# Patient Record
Sex: Male | Born: 1985 | Race: Black or African American | Hispanic: No | Marital: Single | Smoking: Current every day smoker
Health system: Southern US, Community
[De-identification: ages and names within clinical notes are randomized; demographics above are authoritative.]

## PROBLEM LIST (undated history)

## (undated) DIAGNOSIS — R569 Unspecified convulsions: Secondary | ICD-10-CM

---
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
## 2004-04-13 ENCOUNTER — Emergency Department (HOSPITAL_COMMUNITY): Admission: EM | Admit: 2004-04-13 | Discharge: 2004-04-13 | Payer: Self-pay | Admitting: Emergency Medicine

## 2004-06-05 ENCOUNTER — Emergency Department (HOSPITAL_COMMUNITY): Admission: EM | Admit: 2004-06-05 | Discharge: 2004-06-05 | Payer: Self-pay | Admitting: Emergency Medicine

## 2004-06-13 ENCOUNTER — Emergency Department (HOSPITAL_COMMUNITY): Admission: EM | Admit: 2004-06-13 | Discharge: 2004-06-13 | Payer: Self-pay | Admitting: Emergency Medicine

## 2006-02-09 ENCOUNTER — Emergency Department (HOSPITAL_COMMUNITY): Admission: EM | Admit: 2006-02-09 | Discharge: 2006-02-10 | Payer: Self-pay | Admitting: Emergency Medicine

## 2006-07-11 ENCOUNTER — Emergency Department (HOSPITAL_COMMUNITY): Admission: EM | Admit: 2006-07-11 | Discharge: 2006-07-11 | Payer: Self-pay | Admitting: Emergency Medicine

## 2006-09-06 ENCOUNTER — Emergency Department (HOSPITAL_COMMUNITY): Admission: EM | Admit: 2006-09-06 | Discharge: 2006-09-06 | Payer: Self-pay | Admitting: Emergency Medicine

## 2006-11-10 ENCOUNTER — Emergency Department (HOSPITAL_COMMUNITY): Admission: EM | Admit: 2006-11-10 | Discharge: 2006-11-10 | Payer: Self-pay | Admitting: Emergency Medicine

## 2006-11-11 ENCOUNTER — Emergency Department (HOSPITAL_COMMUNITY): Admission: EM | Admit: 2006-11-11 | Discharge: 2006-11-11 | Payer: Self-pay | Admitting: Emergency Medicine

## 2007-02-07 ENCOUNTER — Emergency Department (HOSPITAL_COMMUNITY): Admission: EM | Admit: 2007-02-07 | Discharge: 2007-02-07 | Payer: Self-pay | Admitting: Emergency Medicine

## 2007-05-07 ENCOUNTER — Emergency Department (HOSPITAL_COMMUNITY): Admission: EM | Admit: 2007-05-07 | Discharge: 2007-05-07 | Payer: Self-pay | Admitting: Emergency Medicine

## 2007-05-14 ENCOUNTER — Emergency Department (HOSPITAL_COMMUNITY): Admission: EM | Admit: 2007-05-14 | Discharge: 2007-05-14 | Payer: Self-pay | Admitting: Emergency Medicine

## 2008-01-28 ENCOUNTER — Emergency Department (HOSPITAL_COMMUNITY): Admission: EM | Admit: 2008-01-28 | Discharge: 2008-01-29 | Payer: Self-pay | Admitting: Emergency Medicine

## 2008-04-26 ENCOUNTER — Emergency Department (HOSPITAL_COMMUNITY): Admission: EM | Admit: 2008-04-26 | Discharge: 2008-04-26 | Payer: Self-pay | Admitting: Emergency Medicine

## 2009-01-23 ENCOUNTER — Emergency Department (HOSPITAL_COMMUNITY): Admission: EM | Admit: 2009-01-23 | Discharge: 2009-01-23 | Payer: Self-pay | Admitting: Emergency Medicine

## 2009-03-07 ENCOUNTER — Emergency Department (HOSPITAL_COMMUNITY): Admission: EM | Admit: 2009-03-07 | Discharge: 2009-03-07 | Payer: Self-pay | Admitting: Emergency Medicine

## 2010-03-13 ENCOUNTER — Emergency Department (HOSPITAL_COMMUNITY): Admission: EM | Admit: 2010-03-13 | Discharge: 2010-03-13 | Payer: Self-pay | Admitting: Emergency Medicine

## 2010-05-16 ENCOUNTER — Emergency Department (HOSPITAL_COMMUNITY)
Admission: EM | Admit: 2010-05-16 | Discharge: 2010-05-16 | Payer: Self-pay | Source: Home / Self Care | Admitting: Emergency Medicine

## 2010-10-04 ENCOUNTER — Emergency Department (HOSPITAL_COMMUNITY)
Admission: EM | Admit: 2010-10-04 | Discharge: 2010-10-04 | Discharge status: Against Medical Advice | Payer: Self-pay | Attending: Emergency Medicine | Admitting: Emergency Medicine

## 2010-10-04 DIAGNOSIS — R404 Transient alteration of awareness: Secondary | ICD-10-CM | POA: Insufficient documentation

## 2010-10-04 DIAGNOSIS — J45909 Unspecified asthma, uncomplicated: Secondary | ICD-10-CM | POA: Insufficient documentation

## 2010-10-04 DIAGNOSIS — R569 Unspecified convulsions: Secondary | ICD-10-CM | POA: Insufficient documentation

## 2010-10-04 DIAGNOSIS — I498 Other specified cardiac arrhythmias: Secondary | ICD-10-CM | POA: Insufficient documentation

## 2011-03-10 LAB — I-STAT 8, (EC8 V) (CONVERTED LAB)
Acid-Base Excess: 2
BUN: 10
Bicarbonate: 28.6 — ABNORMAL HIGH
Chloride: 106
Glucose, Bld: 71
HCT: 52
Hemoglobin: 17.7 — ABNORMAL HIGH
Operator id: 284141
Potassium: 4.3
Sodium: 139
TCO2: 30
pCO2, Ven: 50.2 — ABNORMAL HIGH
pH, Ven: 7.363 — ABNORMAL HIGH

## 2011-03-10 LAB — POCT I-STAT CREATININE
Creatinine, Ser: 1
Operator id: 284141

## 2011-04-10 ENCOUNTER — Emergency Department (HOSPITAL_COMMUNITY)
Admission: EM | Admit: 2011-04-10 | Discharge: 2011-04-10 | Disposition: A | Discharge status: Home / Self Care | Payer: Self-pay | Attending: Emergency Medicine | Admitting: Emergency Medicine

## 2011-04-10 ENCOUNTER — Encounter: Payer: Self-pay | Admitting: Emergency Medicine

## 2011-04-10 DIAGNOSIS — Z79899 Other long term (current) drug therapy: Secondary | ICD-10-CM | POA: Insufficient documentation

## 2011-04-10 DIAGNOSIS — G40909 Epilepsy, unspecified, not intractable, without status epilepticus: Secondary | ICD-10-CM | POA: Insufficient documentation

## 2011-04-10 DIAGNOSIS — F17200 Nicotine dependence, unspecified, uncomplicated: Secondary | ICD-10-CM | POA: Insufficient documentation

## 2011-04-10 DIAGNOSIS — R569 Unspecified convulsions: Secondary | ICD-10-CM

## 2011-04-10 HISTORY — DX: Unspecified convulsions: R56.9

## 2011-04-10 MED ORDER — PHENYTOIN SODIUM EXTENDED 100 MG PO CAPS: 300.0000 mg | ORAL_CAPSULE | Freq: Once | ORAL | Status: DC

## 2011-04-10 MED ORDER — PHENYTOIN SODIUM EXTENDED 100 MG PO CAPS: 100.0000 mg | ORAL_CAPSULE | Freq: Three times a day (TID) | ORAL | Status: DC

## 2011-04-10 MED ORDER — PHENYTOIN SODIUM EXTENDED 100 MG PO CAPS
900.0000 mg | ORAL_CAPSULE | Freq: Once | ORAL | Status: DC
  Filled 2011-04-10: qty 9

## 2011-04-10 MED ORDER — PHENYTOIN SODIUM EXTENDED 100 MG PO CAPS
300.0000 mg | ORAL_CAPSULE | Freq: Once | ORAL | Status: AC
  Administered 2011-04-10: 300 mg via ORAL
  Filled 2011-04-10: qty 3

## 2011-04-10 NOTE — ED Notes (Signed)
No acute distress noted. Seizure precautions implemented; padded siderails in place. No seizure activity noted at this time.

## 2011-04-10 NOTE — ED Notes (Signed)
Pt. Had seizure at home witnessed by family lasting a few minutes.  Grand mal according to family.  Pt. Post ictal when ems arrived.  Now he is alert and oriented.  Blood sugar 153.  Pt. Takes dilantin but is non compliant due to finances.

## 2011-04-10 NOTE — ED Notes (Signed)
Dr. Radford Pax informed that per pharmacy, patient's dose of 900 mg must be administered in divided doses.

## 2011-04-10 NOTE — ED Provider Notes (Signed)
History     CSN: [id number] Arrival date & time: 04/10/2011  4:26 PM   First MD Initiated Contact with Patient 04/10/11 1731      Chief Complaint  Patient presents with  . Seizures    HPI Patient brought to emergency room after suffering a generalized tonic-clonic seizure.  Patient has long standing history of seizures.  Patient normally takes Dilantin to prevent seizures but has not taken it for several months because he does not have a physician to prescribe it .  Patient has no other complaints.  Patient did not injure himself after the seizure.  He was postictal.  Patient denies fever, headache, abdominal pain, rash. Past Medical History  Diagnosis Date  . Asthma   . Seizure     No past surgical history on file.  No family history on file.  History  Substance Use Topics  . Smoking status: Current Everyday Smoker  . Smokeless tobacco: Not on file  . Alcohol Use: Yes      Review of Systems  HENT: Negative.   Eyes: Negative.   Respiratory: Negative.   Cardiovascular: Negative.   Gastrointestinal: Negative.   All other systems reviewed and are negative.    Allergies  Review of patient's allergies indicates no known allergies.  Home Medications  No current outpatient prescriptions on file.  BP 151/88  Pulse 124  Temp(Src) 98.3 F (36.8 C) (Oral)  Resp 16  SpO2 94%  Physical Exam  Nursing note and vitals reviewed. Constitutional: He is oriented to person, place, and time. He appears well-developed and well-nourished. No distress.  HENT:  Head: Normocephalic and atraumatic.  Eyes: Pupils are equal, round, and reactive to light.  Neck: Normal range of motion.  Cardiovascular: Normal rate and intact distal pulses.   Pulmonary/Chest: No respiratory distress.  Abdominal: Normal appearance. He exhibits no distension.  Musculoskeletal: Normal range of motion.  Neurological: He is alert and oriented to person, place, and time. No cranial nerve deficit.  Skin:  Skin is warm and dry. No rash noted.  Psychiatric: He has a normal mood and affect. His behavior is normal.    ED Course  Procedures (including critical care time)  Labs Reviewed - No data to display No results found.   No diagnosis found.    MDM   Plan is to load patient with by mouth Dilantin and give him a prescription.  He is encouraged to find a family doctor for followup.  He was instructed not to drive an automobile for 6 months.       Nelia Shi, MD 04/10/11 [phone]

## 2011-04-10 NOTE — ED Notes (Signed)
Secondary assessment: neurological  Pt. Stated he is unsure of what happened.  He was packing his bag to go home and this is all he remembers.  Pt. Remembers being put on stretcher.  No incontinence.  Alert and oriented x 3, abrasion on nose bridge, hit corner of bed.  He reports a headache he rates as an 8/10.

## 2011-04-10 NOTE — ED Notes (Signed)
Spoke with Alan Ripper in Pharmacy. Informed pharmacy that there was an insufficient quantity of Dilantin in Pyxis to make ordered dose of 900 mg. Only 6 pills in Pyxis. Pharmacy to send ordered to dose to ER.

## 2011-04-10 NOTE — ED Notes (Signed)
ZOX:[id number]<AV> Expected date:04/10/11<BR> Expected time: 4:07 PM<BR> Means of arrival:Ambulance<BR> Comments:<BR> EMS 30 GC - seizure with hx

## 2011-07-11 ENCOUNTER — Other Ambulatory Visit: Payer: Self-pay

## 2011-07-11 ENCOUNTER — Encounter (HOSPITAL_COMMUNITY): Payer: Self-pay | Admitting: Emergency Medicine

## 2011-07-11 ENCOUNTER — Emergency Department (HOSPITAL_COMMUNITY)
Admission: EM | Admit: 2011-07-11 | Discharge: 2011-07-11 | Discharge status: Against Medical Advice | Payer: Self-pay | Attending: Emergency Medicine | Admitting: Emergency Medicine

## 2011-07-11 DIAGNOSIS — J45909 Unspecified asthma, uncomplicated: Secondary | ICD-10-CM | POA: Insufficient documentation

## 2011-07-11 DIAGNOSIS — M25519 Pain in unspecified shoulder: Secondary | ICD-10-CM | POA: Insufficient documentation

## 2011-07-11 DIAGNOSIS — G40909 Epilepsy, unspecified, not intractable, without status epilepticus: Secondary | ICD-10-CM | POA: Insufficient documentation

## 2011-07-11 DIAGNOSIS — F1410 Cocaine abuse, uncomplicated: Secondary | ICD-10-CM | POA: Insufficient documentation

## 2011-07-11 DIAGNOSIS — S1093XA Contusion of unspecified part of neck, initial encounter: Secondary | ICD-10-CM | POA: Insufficient documentation

## 2011-07-11 DIAGNOSIS — Z79899 Other long term (current) drug therapy: Secondary | ICD-10-CM | POA: Insufficient documentation

## 2011-07-11 DIAGNOSIS — S0003XA Contusion of scalp, initial encounter: Secondary | ICD-10-CM | POA: Insufficient documentation

## 2011-07-11 DIAGNOSIS — W503XXA Accidental bite by another person, initial encounter: Secondary | ICD-10-CM | POA: Insufficient documentation

## 2011-07-11 DIAGNOSIS — R51 Headache: Secondary | ICD-10-CM | POA: Insufficient documentation

## 2011-07-11 DIAGNOSIS — F29 Unspecified psychosis not due to a substance or known physiological condition: Secondary | ICD-10-CM | POA: Insufficient documentation

## 2011-07-11 DIAGNOSIS — R569 Unspecified convulsions: Secondary | ICD-10-CM

## 2011-07-11 LAB — DIFFERENTIAL
Lymphocytes Relative: 42 % (ref 12–46)
Lymphs Abs: 4.3 10*3/uL — ABNORMAL HIGH (ref 0.7–4.0)
Monocytes Relative: 7 % (ref 3–12)
Neutro Abs: 4.9 10*3/uL (ref 1.7–7.7)
Neutrophils Relative %: 48 % (ref 43–77)

## 2011-07-11 LAB — RAPID URINE DRUG SCREEN, HOSP PERFORMED
Barbiturates: NOT DETECTED
Benzodiazepines: NOT DETECTED
Cocaine: POSITIVE — AB
Tetrahydrocannabinol: NOT DETECTED

## 2011-07-11 LAB — BASIC METABOLIC PANEL
BUN: 12 mg/dL (ref 6–23)
Calcium: 9.6 mg/dL (ref 8.4–10.5)
Creatinine, Ser: 1.03 mg/dL (ref 0.50–1.35)
GFR calc Af Amer: >90 mL/min (ref >90)

## 2011-07-11 LAB — PHENYTOIN LEVEL, TOTAL: Phenytoin Lvl: <2.5 ug/mL — ABNORMAL LOW (ref 10.0–20.0)

## 2011-07-11 LAB — CBC
Hemoglobin: 15.6 g/dL (ref 13.0–17.0)
MCH: 32.3 pg (ref 26.0–34.0)
RBC: 4.83 MIL/uL (ref 4.22–5.81)
WBC: 10.3 10*3/uL (ref 4.0–10.5)

## 2011-07-11 MED ORDER — SODIUM CHLORIDE 0.9 % IV SOLN
1000.0000 mg | Freq: Once | INTRAVENOUS | Status: DC
  Filled 2011-07-11: qty 20

## 2011-07-11 MED ORDER — PHENYTOIN SODIUM EXTENDED 100 MG PO CAPS
300.0000 mg | ORAL_CAPSULE | Freq: Once | ORAL | Status: AC
  Administered 2011-07-11: 300 mg via ORAL
  Filled 2011-07-11: qty 3

## 2011-07-11 MED ORDER — PHENYTOIN SODIUM EXTENDED 100 MG PO CAPS: 100.0000 mg | ORAL_CAPSULE | Freq: Three times a day (TID) | ORAL | Status: DC

## 2011-07-11 MED ORDER — IBUPROFEN 800 MG PO TABS
800.0000 mg | ORAL_TABLET | Freq: Once | ORAL | Status: AC
  Administered 2011-07-11: 800 mg via ORAL
  Filled 2011-07-11: qty 1

## 2011-07-11 NOTE — ED Notes (Signed)
Per EMS, pt has history of seizures and had two seizures tonight.  Per EMS, pt non-compliant with seizure medications x 1 month.  Per EMS, pt talking about leaving AMA prior to arriving in ED.  Per EMS, SpO2 98% on room air, BP 170/90, HR 110, and pt alert and oriented x 4.

## 2011-07-11 NOTE — ED Provider Notes (Signed)
History     CSN: [id number]  Arrival date & time 07/11/11  1959   First MD Initiated Contact with Patient 07/11/11 2009      Chief Complaint  Patient presents with  . Seizures     HPI  History provided by the patient. Patient is a 26 year old male with past history of asthma and seizure disorder who presents with complaints of 2 seizure episodes prior to arrival. Episodes are described as brief lasting only a few minutes at most. Patient states that he did fall from a standing position but does not remember any of the incident. He has some soreness to his left shoulder area and to the tip of his tongue. There was no urinary or fecal incontinence. Patient admits to being noncompliant with his Dilantin for over the past month. Patient was first diagnosed with seizures at the age of 52. He reports having a seizure about once every 3-4 months. Patient currently not being followed by PCP or neurology specialist. Patient denies having any recent URI symptoms. Patient reports otherwise feeling well recently with no complaints.    Past Medical History  Diagnosis Date  . Asthma   . Seizure     History reviewed. No pertinent past surgical history.  No family history on file.  History  Substance Use Topics  . Smoking status: Current Everyday Smoker  . Smokeless tobacco: Not on file  . Alcohol Use: Yes      Review of Systems  Constitutional: Negative for fever and chills.  Respiratory: Negative for cough and shortness of breath.   Cardiovascular: Negative for chest pain and palpitations.  Gastrointestinal: Negative for abdominal pain.  Neurological: Negative for dizziness, numbness and headaches.  Psychiatric/Behavioral: Positive for confusion.  All other systems reviewed and are negative.    Allergies  Review of patient's allergies indicates no known allergies.  Home Medications   Current Outpatient Rx  Name Route Sig Dispense Refill  . PHENYTOIN SODIUM EXTENDED 100 MG PO  CAPS Oral Take 1 capsule (100 mg total) by mouth 3 (three) times daily. 90 capsule 0    BP 138/80  Pulse 104  Temp(Src) 98.2 F (36.8 C) (Oral)  Resp 15  SpO2 97%  Physical Exam  Nursing note and vitals reviewed. Constitutional: He is oriented to person, place, and time. He appears well-developed and well-nourished. No distress.  HENT:  Head: Normocephalic and atraumatic.  Mouth/Throat: Oropharynx is clear and moist.       Bite marks to the tongue was no gaping wound.  Eyes: Conjunctivae and EOM are normal. Pupils are equal, round, and reactive to light.  Neck: Normal range of motion. Neck supple.       No central midline tenderness to palpation. No meningeal signs  Cardiovascular: Normal rate and regular rhythm.   No murmur heard. Pulmonary/Chest: Effort normal and breath sounds normal. He has no wheezes. He has no rales.  Abdominal: Soft.  Neurological: He is alert and oriented to person, place, and time. He has normal strength. No cranial nerve deficit or sensory deficit.  Skin: Skin is warm. No rash noted.  Psychiatric: He has a normal mood and affect. His behavior is normal.    ED Course  Procedures    Labs Reviewed  CBC  DIFFERENTIAL  URINE RAPID DRUG SCREEN (HOSP PERFORMED)  BASIC METABOLIC PANEL  PHENYTOIN LEVEL, TOTAL   Results for orders placed during the hospital encounter of 07/11/11  CBC      Component Value Range  WBC 10.3  4.0 - 10.5 (K/uL)   RBC 4.83  4.22 - 5.81 (MIL/uL)   Hemoglobin 15.6  13.0 - 17.0 (g/dL)   HCT 04.5  40.9 - 81.1 (%)   MCV 89.6  78.0 - 100.0 (fL)   MCH 32.3  26.0 - 34.0 (pg)   MCHC 36.0  30.0 - 36.0 (g/dL)   RDW 91.4  78.2 - 95.6 (%)   Platelets 201  150 - 400 (K/uL)  DIFFERENTIAL      Component Value Range   Neutrophils Relative 48  43 - 77 (%)   Neutro Abs 4.9  1.7 - 7.7 (K/uL)   Lymphocytes Relative 42  12 - 46 (%)   Lymphs Abs 4.3 (*) 0.7 - 4.0 (K/uL)   Monocytes Relative 7  3 - 12 (%)   Monocytes Absolute 0.7  0.1 -  1.0 (K/uL)   Eosinophils Relative 2  0 - 5 (%)   Eosinophils Absolute 0.2  0.0 - 0.7 (K/uL)   Basophils Relative 1  0 - 1 (%)   Basophils Absolute 0.1  0.0 - 0.1 (K/uL)  URINE RAPID DRUG SCREEN (HOSP PERFORMED)      Component Value Range   Opiates NONE DETECTED  NONE DETECTED    Cocaine POSITIVE (*) NONE DETECTED    Benzodiazepines NONE DETECTED  NONE DETECTED    Amphetamines NONE DETECTED  NONE DETECTED    Tetrahydrocannabinol NONE DETECTED  NONE DETECTED    Barbiturates NONE DETECTED  NONE DETECTED   BASIC METABOLIC PANEL      Component Value Range   Sodium 140  135 - 145 (mEq/L)   Potassium 4.1  3.5 - 5.1 (mEq/L)   Chloride 103  96 - 112 (mEq/L)   CO2 24  19 - 32 (mEq/L)   Glucose, Bld 85  70 - 99 (mg/dL)   BUN 12  6 - 23 (mg/dL)   Creatinine, Ser 2.13  0.50 - 1.35 (mg/dL)   Calcium 9.6  8.4 - 08.6 (mg/dL)   GFR calc non Af Amer >90  >90 (mL/min)   GFR calc Af Amer >90  >90 (mL/min)       1. Seizure   2. Cocaine abuse       MDM  8:10 PM patient seen and evaluated. Patient in no acute distress. Patient currently awake and oriented with GCS 15.   9:15 PM patient is positive for cocaine. Labs otherwise unremarkable so far. Pt discussed with Attending Physician, plan to dose with Dilantin. Patient does not currently have prescription for Dilantin. Will provide this for him.   9:35PM  Pt has refused IV dilantin.  PO Dilantin was given.  He wishes to leave AMA. Pt advised of risk including additional seizures and death.    Date: 07/23/2011  Rate: 105  Rhythm: sinus tachycardia  QRS Axis: normal  Intervals: normal  ST/T Wave abnormalities: normal  Conduction Disutrbances:none  Narrative Interpretation:   Old EKG Reviewed: unchanged from 10/04/2010      Angus Seller, PA 23-Jul-2011 2206

## 2011-07-11 NOTE — ED Notes (Signed)
Pt stating his only ride home is on their way and has to be at work at 19:30.  Pt stating he needs to be ready to leave by then.  Pt encouraged to stay for medical care.  ED PA made aware.

## 2011-07-11 NOTE — ED Notes (Signed)
BJY:[id number]<FA> Expected date:07/11/11<BR> Expected time: 7:53 PM<BR> Means of arrival:Ambulance<BR> Comments:<BR> EMS 231 GC, 26 yom seizure, non-compliant w meds

## 2011-07-11 NOTE — ED Notes (Signed)
Pt asked if he can drink alcohol with his medications and patient educated about the dangers of combining alcohol with his medications.  Patient verbalized understanding and stated he will not drink alcohol with his medications.

## 2011-07-12 NOTE — ED Provider Notes (Signed)
Medical screening examination/treatment/procedure(s) were performed by non-physician practitioner and as supervising physician I was immediately available for consultation/collaboration.   Laray Anger, DO 07/12/11 [phone]

## 2011-08-14 ENCOUNTER — Encounter (HOSPITAL_COMMUNITY): Payer: Self-pay | Admitting: General Practice

## 2011-08-14 ENCOUNTER — Emergency Department (HOSPITAL_COMMUNITY)
Admission: EM | Admit: 2011-08-14 | Discharge: 2011-08-14 | Disposition: A | Discharge status: Home / Self Care | Payer: Self-pay | Attending: Emergency Medicine | Admitting: Emergency Medicine

## 2011-08-14 DIAGNOSIS — X58XXXA Exposure to other specified factors, initial encounter: Secondary | ICD-10-CM | POA: Insufficient documentation

## 2011-08-14 DIAGNOSIS — R51 Headache: Secondary | ICD-10-CM | POA: Insufficient documentation

## 2011-08-14 DIAGNOSIS — G40909 Epilepsy, unspecified, not intractable, without status epilepticus: Secondary | ICD-10-CM | POA: Insufficient documentation

## 2011-08-14 DIAGNOSIS — IMO0002 Reserved for concepts with insufficient information to code with codable children: Secondary | ICD-10-CM | POA: Insufficient documentation

## 2011-08-14 DIAGNOSIS — R569 Unspecified convulsions: Secondary | ICD-10-CM

## 2011-08-14 LAB — POCT I-STAT, CHEM 8
BUN: 11 mg/dL (ref 6–23)
Calcium, Ion: 1.22 mmol/L (ref 1.12–1.32)
Glucose, Bld: 101 mg/dL — ABNORMAL HIGH (ref 70–99)
HCT: 50 % (ref 39.0–52.0)
TCO2: 23 mmol/L (ref 0–100)

## 2011-08-14 MED ORDER — LORAZEPAM 1 MG PO TABS
2.0000 mg | ORAL_TABLET | Freq: Once | ORAL | Status: AC
  Administered 2011-08-14: 2 mg via ORAL
  Filled 2011-08-14: qty 2

## 2011-08-14 MED ORDER — PHENYTOIN SODIUM EXTENDED 100 MG PO CAPS
300.0000 mg | ORAL_CAPSULE | Freq: Once | ORAL | Status: AC
  Administered 2011-08-14: 300 mg via ORAL
  Filled 2011-08-14: qty 3

## 2011-08-14 NOTE — Discharge Instructions (Signed)
Driving and Equipment Restrictions Some medical problems make it dangerous to drive, ride a bike, or use machines. Some of these problems are:  A hard blow to the head (concussion).   Passing out (fainting).   Twitching and shaking (seizures).   Low blood sugar.   Taking medicine to help you relax (sedatives).   Taking pain medicines.   Wearing an eye patch.   Wearing splints. This can make it hard to use parts of your body that you need to drive safely.  HOME CARE   Do not drive until your doctor says it is okay.   Do not use machines until your doctor says it is okay.  You may need a form signed by your doctor (medical release) before you can drive again. You may also need this form before you do other tasks where you need to be fully alert. MAKE SURE YOU:  Understand these instructions.   Will watch your condition.   Will get help right away if you are not doing well or get worse.  Document Released: 06/26/2004 Document Revised: 05/08/2011 Document Reviewed: 09/26/2009 [facility] Patient Information 2012 [place], [place].  Epilepsy A seizure (convulsion) is a sudden change in brain function that causes a change in behavior, muscle activity, or ability to remain awake and alert. If a person has recurring seizures, this is called epilepsy. CAUSES  Epilepsy is a disorder with many possible causes. Anything that disturbs the normal pattern of brain cell activity can lead to seizures. Seizure can be caused from illness to brain damage to abnormal brain development. Epilepsy may develop because of:  An abnormality in brain wiring.   An imbalance of nerve signaling chemicals (neurotransmitters).   Some combination of these factors.  Scientists are learning an increasing amount about genetic causes of seizures. SYMPTOMS  The symptoms of a seizure can vary greatly from one person to another. These may include:  An aura, or warning that tells a person they are about to have a  seizure.   Abnormal sensations, such as abnormal smell or seeing flashing lights.   Sudden, general body stiffness.   Rhythmic jerking of the face, arm, or leg - on one or both sides.   Sudden change in consciousness.   The person may appear to be awake but not responding.   They may appear to be asleep but cannot be awakened.   Grimacing, chewing, lip smacking, or drooling.   Often there is a period of sleepiness after a seizure.  DIAGNOSIS  The description you give to your caregiver about what you experienced will help them understand your problems. Equally important is the description by any witnesses to your seizure. A physical exam, including a detailed neurological exam, is necessary. An EEG (electroencephalogram) is a painless test of your brain waves. In this test a diagram is created of your brain waves. These diagrams can be interpreted by a specialist. Pictures of your brain are usually taken with:  An MRI.   A CT scan.  Lab tests may be done to look for:  Signs of infection.   Abnormal blood chemistry.  PREVENTION  There is no way to prevent the development of epilepsy. If you have seizures that are typically triggered by an event (such as flashing lights), try to avoid the trigger. This can help you avoid a seizure.  PROGNOSIS  Most people with epilepsy lead outwardly normal lives. While epilepsy cannot currently be cured, for some people it does eventually go away. Most seizures  do not cause brain damage. It is not uncommon for people with epilepsy, especially children, to develop behavioral and emotional problems. These problems are sometimes the consequence of medicine for seizures or social stress. For some people with epilepsy, the risk of seizures restricts their independence and recreational activities. For example, some states refuse drivers licenses to people with epilepsy. Most women with epilepsy can become pregnant. They should discuss their epilepsy and the  medicine they are taking with their caregivers. Women with epilepsy have a 90 percent or better chance of having a normal, healthy baby. RISKS AND COMPLICATIONS  People with epilepsy are at increased risk of falls, accidents, and injuries. People with epilepsy are at special risk for two life-threatening conditions. These are status epilepticus and sudden unexplained death (extremely rare). Status epilepticus is a long lasting, continuous seizure that is a medical emergency. TREATMENT  Once epilepsy is diagnosed, it is important to begin treatment as soon as possible. For about 80 percent of those diagnosed with epilepsy, seizures can be controlled with modern medicines and surgical techniques. Some antiepileptic drugs can interfere with the effectiveness of oral contraceptives. In 1997, the FDA approved a pacemaker for the brain the (vagus nerve stimulator). This stimulator can be used for people with seizures that are not well-controlled by medicine. Studies have shown that in some cases, children may experience fewer seizures if they maintain a strict diet. The strict diet is called the ketogenic diet. This diet is rich in fats and low in carbohydrates. HOME CARE INSTRUCTIONS   Your caregiver will make recommendations about driving and safety in normal activities. Follow these carefully.   Take any medicine prescribed exactly as directed.   Do any blood tests requested to monitor the levels of your medicine.   The people you live and work with should know that you are prone to seizures. They should receive instructions on how to help you. In general, a witness to a seizure should:   Cushion your head and body.   Turn you on your side.   Avoid unnecessarily restraining you.   Not place anything inside your mouth.   Call for local emergency medical help if there is any question about what has occurred.   Keep a seizure diary. Record what you recall about any seizure, especially any possible  trigger.   If your caregiver has given you a follow-up appointment, it is very important to keep that appointment. Not keeping the appointment could result in permanent injury and disability. If there is any problem keeping the appointment, you must call back to this facility for assistance.  SEEK MEDICAL CARE IF:   You develop signs of infection or other illness. This might increase the risk of a seizure.   You seem to be having more frequent seizures.   Your seizure pattern is changing.  SEEK IMMEDIATE MEDICAL CARE IF:   A seizure does not stop after a few moments.   A seizure causes any difficulty in breathing.   A seizure results in a very severe headache.   A seizure leaves you with the inability to speak or use a part of your body.  MAKE SURE YOU:   Understand these instructions.   Will watch your condition.   Will get help right away if you are not doing well or get worse.  Document Released: 05/19/2005 Document Revised: 05/08/2011 Document Reviewed: 12/24/2007 [facility] Patient Information 2012 [place], [place].  RESOURCE GUIDE  Dental Problems  Patients with Medicaid: [facility]  [facility] [address]                                           [phone] [address] Phone:  [phone]                                                   Phone:  [phone]  If unable to pay or uninsured, contact:  [facility] or [facility]. to become qualified for the adult dental clinic.  Chronic Pain Problems Contact [facility]  [phone] Patients need to be referred by their primary care doctor.  Insufficient Money for Medicine Contact [employer]:  call "211" or [facility] [phone].  No Primary Care Doctor Call [facility]  [phone] Other agencies that provide inexpensive medical care    [facility]  [phone]    [facility]  [phone]    [facility]  [phone]    [facility]  [phone]    [facility]  [phone]    [facility]  [phone]  Psychological Services [facility]  [phone] [facility]  [phone] [facility] Mental Health   [phone] (emergency services [phone])  Abuse/Neglect [facility] Child Abuse Hotline [phone] [facility] Child Abuse Hotline 218-005-6211 (After Hours)  Emergency Shelter [facility] [phone]  [facility] [place] [facility] [phone] [facility] [phone]  MRSA Hotline #:   5817185801    [facility] Resources  [facility]  [employer]                           [facility] Dept. [address] [place]                     [address]         [address]  [place]ondell Reveal Phone:  [phone]                                  Phone:  [phone]                   Phone:  [phone]  [facility] Phone:  [phone]  [facility] Child Abuse Hotline [phone] (After Hours)

## 2011-08-14 NOTE — ED Notes (Signed)
Patient discharged to home via private vehicle, no complaints, discussed medication and obtaining prescription for seizure meds, patient states his family may be able to help him, resources in the community discussed

## 2011-08-14 NOTE — ED Notes (Signed)
ZOX:WR60<AV> Expected date:08/14/11<BR> Expected time: 7:29 AM<BR> Means of arrival:Ambulance<BR> Comments:<BR> seizure

## 2011-08-14 NOTE — ED Provider Notes (Signed)
History     CSN: [id number]  Arrival date & time 08/14/11  4098   First MD Initiated Contact with Patient 08/14/11 [phone]      Chief Complaint  Patient presents with  . Seizures    h/o seizure3 weeks ago, full body lasting , witnessed    (Consider location/radiation/quality/duration/timing/severity/associated sxs/prior treatment) HPI   H/o seizure d/o non compliant with medications pw seizure. Per patient seizure at home. Seizure witnessed by father who is not present in the ED. Patient states last seizure was 3-4 weeks ago. Complaining of headache which is typical post seizure for him, and left shoulder pain. It is unknown how long the seizure lasted but per nursing and this report it was a generalized seizure lasting approximately 2 minutes and resolving without intervention per EMS. Patient denies numbness, tingling, weakness of his extremities. He denies known head trauma. He states that he does not have insurance and cannot afford his Dilantin. He has not seen his neurologist or primary care doctor in quite some time. He does state that he had alcohol yesterday as well as a lack of sleep recently.  Past Medical History  Diagnosis Date  . Asthma   . Seizure     History reviewed. No pertinent past surgical history.  History reviewed. No pertinent family history.  History  Substance Use Topics  . Smoking status: Current Everyday Smoker  . Smokeless tobacco: Not on file  . Alcohol Use: Yes      Review of Systems  All other systems reviewed and are negative.   except as noted HPI    Allergies  Review of patient's allergies indicates no known allergies.  Home Medications   Current Outpatient Rx  Name Route Sig Dispense Refill  . PHENYTOIN SODIUM EXTENDED 100 MG PO CAPS Oral Take 1 capsule (100 mg total) by mouth 3 (three) times daily. 90 capsule 0    BP 120/70  Pulse 98  Temp 98.5 F (36.9 C)  Resp 20  Wt 127 lb 14.4 oz (58.015 kg)  SpO2  100%  Physical Exam  Nursing note and vitals reviewed. Constitutional: He is oriented to person, place, and time. He appears well-developed and well-nourished. No distress.  HENT:  Head: Atraumatic.  Mouth/Throat: Oropharynx is clear and moist.  Eyes: Conjunctivae are normal. Pupils are equal, round, and reactive to light.  Neck: Neck supple.  Cardiovascular: Normal rate, regular rhythm, normal heart sounds and intact distal pulses.  Exam reveals no gallop and no friction rub.   No murmur heard. Pulmonary/Chest: Effort normal. No respiratory distress. He has no wheezes. He has no rales.  Abdominal: Soft. Bowel sounds are normal. There is no tenderness. There is no rebound and no guarding.  Musculoskeletal: Normal range of motion. He exhibits no edema and no tenderness.  Neurological: He is alert and oriented to person, place, and time. No cranial nerve deficit. He exhibits normal muscle tone. Coordination normal.       Strength 5/5 all extremities No pronator drift No facial droop   Skin: Skin is warm and dry.       Min abrasion lt shoulder over scapula No bony ttp  Psychiatric: He has a normal mood and affect.    ED Course  Procedures (including critical care time)  Labs Reviewed - No data to display No results found.   1. Seizure      MDM  History of seizure disorder presents with seizure. Likely multifactorial from noncompliance, lack of sleep and alcohol ingestion.  Minimal abrasion in his left shoulder. There is no dislocation. There is no bony tenderness. He is neurovascularly intact in his tetanus is up-to-date. Patient refusing IV Dilantin. Will load with oral dose 15-20mg /kg. Confirmed oral loading dose with pharmacy. Consult case management to possibly help with obtaining his medication.  8:55 AM  Patient signed out AMA last visit and refusing to stay for proper oral loading every 2 hours to reduce GI side effects. Given oral dose of dilantin and will be discharged  home. States he does not want to talk to case Production designer, theatre/television/film. Discussed with his father who will obtain his dilantin from [facility]. Previous prescription. Driving/equipment restrictions. PMD referral.       Forbes Cellar, MD 08/14/11 [phone]

## 2011-11-15 ENCOUNTER — Emergency Department (HOSPITAL_COMMUNITY)
Admission: EM | Admit: 2011-11-15 | Discharge: 2011-11-15 | Disposition: A | Discharge status: Home / Self Care | Payer: Self-pay | Attending: Emergency Medicine | Admitting: Emergency Medicine

## 2011-11-15 ENCOUNTER — Encounter (HOSPITAL_COMMUNITY): Payer: Self-pay | Admitting: Emergency Medicine

## 2011-11-15 DIAGNOSIS — F17200 Nicotine dependence, unspecified, uncomplicated: Secondary | ICD-10-CM | POA: Insufficient documentation

## 2011-11-15 DIAGNOSIS — R569 Unspecified convulsions: Secondary | ICD-10-CM | POA: Insufficient documentation

## 2011-11-15 LAB — GLUCOSE, CAPILLARY: Glucose-Capillary: 86 mg/dL (ref 70–99)

## 2011-11-15 MED ORDER — PHENYTOIN SODIUM EXTENDED 100 MG PO CAPS: 300.0000 mg | ORAL_CAPSULE | Freq: Every day | ORAL | Status: DC

## 2011-11-15 MED ORDER — PHENYTOIN SODIUM EXTENDED 100 MG PO CAPS
300.0000 mg | ORAL_CAPSULE | Freq: Once | ORAL | Status: AC
  Administered 2011-11-15: 300 mg via ORAL
  Filled 2011-11-15: qty 3

## 2011-11-15 NOTE — ED Notes (Signed)
FAO:[id number]<MV> Expected date:11/15/11<BR> Expected time:<BR> Means of arrival:<BR> Comments:<BR> EMS 80 GC - seizure

## 2011-11-15 NOTE — ED Notes (Signed)
Per EMS.  Pt from home.  Friend heard pt fall in bathroom, went in and found pt in grand mal seizure.  Friend stated that seizure lasted a minute to 2 minutes.  Friend stated that pt was confused afterwords, but upon EMS arrival pt was dressing self and oriented.  Pt has hx of epilepsy, and states that MD took him off dilantin "a long time ago."

## 2011-11-15 NOTE — ED Notes (Signed)
Pt states he was in bed when he had the seizure.  Pt says that last dose of dilantin was when he was in the emergency department.  States he was prescribed dilantin afterwords, but was unable to track down his prescription to get it filled.  Pt states it has been months since he has taken dilantin at home.

## 2011-11-15 NOTE — ED Notes (Signed)
CBG registered 86 on ED Glucometer 

## 2011-11-15 NOTE — ED Provider Notes (Signed)
History     CSN: [id number]  Arrival date & time 11/15/11  1247   First MD Initiated Contact with Patient 11/15/11 1310      Chief Complaint  Patient presents with  . Seizures    (Consider location/radiation/quality/duration/timing/severity/associated sxs/prior treatment) Patient is a 26 y.o. [place] presenting with seizures. The history is provided by the patient.  Seizures  Pertinent negatives include no headaches, no visual disturbance, no chest pain, no vomiting and no diarrhea.  pt w hx seizures for past 10 yrs (?idiopathic) states had seizure today. Pt states he believes last seizure was approximately 3-4 months ago. Denies recent increase in seizure frequency. States recent health at baseline. Denies sleep deprivation. Notes episodic etoh use including last night, but denies daily use. Denies any recent head injury or trauma. No recent febrile or uri illness. No nvd. No gu c/o. Denies recent change in meds, although hasnt taken his seizure medication in 6 months. States feels well/baseline. No pain. No headache. No neck or back pain. todays seizure lasted 1-2 minutes, pt at baseline by time ems arrived. No oral injury, no incontinence.    Past Medical History  Diagnosis Date  . Asthma   . Seizure     History reviewed. No pertinent past surgical history.  History reviewed. No pertinent family history.  History  Substance Use Topics  . Smoking status: Current Everyday Smoker  . Smokeless tobacco: Not on file  . Alcohol Use: Yes      Review of Systems  Constitutional: Negative for fever and chills.  HENT: Negative for neck pain and neck stiffness.   Eyes: Negative for pain and visual disturbance.  Respiratory: Negative for shortness of breath.   Cardiovascular: Negative for chest pain, palpitations and leg swelling.  Gastrointestinal: Negative for vomiting, abdominal pain and diarrhea.  Genitourinary: Negative for dysuria and flank pain.  Musculoskeletal: Negative for  back pain.  Skin: Negative for rash.  Neurological: Positive for seizures. Negative for weakness, numbness and headaches.  Hematological: Does not bruise/bleed easily.  Psychiatric/Behavioral: Negative for dysphoric mood.    Allergies  Review of patient's allergies indicates no known allergies.  Home Medications   Current Outpatient Rx  Name Route Sig Dispense Refill  . PHENYTOIN SODIUM EXTENDED 100 MG PO CAPS Oral Take 1 capsule (100 mg total) by mouth 3 (three) times daily. 90 capsule 0    BP 139/78  Pulse 93  Temp 98.1 F (36.7 C) (Oral)  Resp 18  SpO2 99%  Physical Exam  Nursing note and vitals reviewed. Constitutional: He is oriented to person, place, and time. He appears well-developed and well-nourished. No distress.  HENT:  Head: Atraumatic.  Nose: Nose normal.  Mouth/Throat: Oropharynx is clear and moist.       No oral trauma or laceration.  Eyes: Conjunctivae and EOM are normal. Pupils are equal, round, and reactive to light. No scleral icterus.  Neck: Normal range of motion. Neck supple. No tracheal deviation present.       No stiffness or rigidity  Cardiovascular: Normal rate, regular rhythm, normal heart sounds and intact distal pulses.  Exam reveals no gallop and no friction rub.   No murmur heard. Pulmonary/Chest: Effort normal and breath sounds normal. No accessory muscle usage. No respiratory distress. He exhibits no tenderness.  Abdominal: Soft. Bowel sounds are normal. He exhibits no distension. There is no tenderness.  Musculoskeletal: Normal range of motion. He exhibits no edema and no tenderness.       CTLS spine,  non tender, aligned, no step off. Good rom bil ext without pain.   Neurological: He is alert and oriented to person, place, and time. No cranial nerve deficit.       Motor intact bil. Steady gait.   Skin: Skin is warm and dry.  Psychiatric: He has a normal mood and affect.    ED Course  Procedures (including critical care  time)   Labs Reviewed  GLUCOSE, CAPILLARY   Results for orders placed during the hospital encounter of 11/15/11  GLUCOSE, CAPILLARY      Component Value Range   Glucose-Capillary 86  70 - 99 mg/dL      MDM  Pt refuses dilantin iv of iv/iv meds.  States has taken dilantin in past without adverse rxn but hasnt taken x 4-5 months.  Dilantin 300 mg po.    Blood sugar normal. Pt states feels at baseline, requests d/c.      Suzi Roots, MD 11/15/11 1341

## 2011-11-15 NOTE — Discharge Instructions (Signed)
Take seizure medication as prescribed. No driving or operating heavy machinery until cleared to do so by your doctor. Follow up with primary care doctor/neurologist in next 1-2 weeks. Return to ER if worse, recurrent seizures, fevers, severe headache, other concern.

## 2011-12-16 ENCOUNTER — Encounter (HOSPITAL_COMMUNITY): Payer: Self-pay | Admitting: *Deleted

## 2011-12-16 ENCOUNTER — Emergency Department (HOSPITAL_COMMUNITY)
Admission: EM | Admit: 2011-12-16 | Discharge: 2011-12-16 | Disposition: A | Discharge status: Home / Self Care | Payer: Self-pay | Attending: Emergency Medicine | Admitting: Emergency Medicine

## 2011-12-16 DIAGNOSIS — R0602 Shortness of breath: Secondary | ICD-10-CM | POA: Insufficient documentation

## 2011-12-16 DIAGNOSIS — J40 Bronchitis, not specified as acute or chronic: Secondary | ICD-10-CM | POA: Insufficient documentation

## 2011-12-16 DIAGNOSIS — R059 Cough, unspecified: Secondary | ICD-10-CM | POA: Insufficient documentation

## 2011-12-16 DIAGNOSIS — J029 Acute pharyngitis, unspecified: Secondary | ICD-10-CM | POA: Insufficient documentation

## 2011-12-16 DIAGNOSIS — R05 Cough: Secondary | ICD-10-CM | POA: Insufficient documentation

## 2011-12-16 MED ORDER — ALBUTEROL SULFATE (5 MG/ML) 0.5% IN NEBU
5.0000 mg | INHALATION_SOLUTION | Freq: Once | RESPIRATORY_TRACT | Status: AC
  Administered 2011-12-16: 5 mg via RESPIRATORY_TRACT
  Filled 2011-12-16 (×2): qty 0.5

## 2011-12-16 MED ORDER — AEROCHAMBER Z-STAT PLUS/MEDIUM MISC
1.0000 | Freq: Once | Status: AC
  Administered 2011-12-16: 1

## 2011-12-16 MED ORDER — IBUPROFEN 800 MG PO TABS
800.0000 mg | ORAL_TABLET | Freq: Once | ORAL | Status: AC
  Administered 2011-12-16: 800 mg via ORAL
  Filled 2011-12-16: qty 1

## 2011-12-16 MED ORDER — PREDNISONE 20 MG PO TABS: 40.0000 mg | ORAL_TABLET | Freq: Every day | ORAL | Status: AC

## 2011-12-16 MED ORDER — ALBUTEROL SULFATE HFA 108 (90 BASE) MCG/ACT IN AERS
2.0000 | INHALATION_SPRAY | Freq: Once | RESPIRATORY_TRACT | Status: AC
  Administered 2011-12-16: 2 via RESPIRATORY_TRACT
  Filled 2011-12-16: qty 6.7

## 2011-12-16 NOTE — ED Provider Notes (Signed)
History     CSN: [id number]  Arrival date & time 12/16/11  1245   None     Chief Complaint  Patient presents with  . Sore Throat  . Cough  . Shortness of Breath    (Consider location/radiation/quality/duration/timing/severity/associated sxs/prior treatment) HPI Comments: Patient presents with complaint of cough, like previous episode of bronchitis since last night. Patient states that he also has a sore throat he thinks is due to coughing. Cough is productive of white mucus. Patient has tried TheraFlu and Advil without relief of symptoms. He has used his inhaler with minimal relief. Patient denies fever, runny nose, ear pain, nausea or vomiting. Patient does have history of asthma. Onset was gradual. Course is constant. Nothing makes the symptoms worse.  Patient is a 26 y.o. male presenting with cough. The history is provided by the patient.  Cough This is a new problem. The current episode started 12 to 24 hours ago. The problem has not changed since onset.The cough is productive of sputum. There has been no fever. Associated symptoms include sore throat (pt states from coughing), shortness of breath and wheezing. Pertinent negatives include no chest pain, no chills, no headaches, no rhinorrhea, no myalgias and no eye redness. The treatment provided mild relief. His past medical history is significant for bronchitis and asthma.    Past Medical History  Diagnosis Date  . Asthma   . Seizure     History reviewed. No pertinent past surgical history.  No family history on file.  History  Substance Use Topics  . Smoking status: Current Everyday Smoker  . Smokeless tobacco: Not on file  . Alcohol Use: Yes      Review of Systems  Constitutional: Negative for fever and chills.  HENT: Positive for sore throat (pt states from coughing). Negative for rhinorrhea.   Eyes: Negative for redness.  Respiratory: Positive for cough, chest tightness, shortness of breath and wheezing.     Cardiovascular: Negative for chest pain.  Gastrointestinal: Negative for nausea, vomiting, abdominal pain and diarrhea.  Genitourinary: Negative for dysuria.  Musculoskeletal: Negative for myalgias.  Skin: Negative for rash.  Neurological: Negative for headaches.    Allergies  Review of patient's allergies indicates no known allergies.  Home Medications   Current Outpatient Rx  Name Route Sig Dispense Refill  . PHENYTOIN SODIUM EXTENDED 100 MG PO CAPS Oral Take 1 capsule (100 mg total) by mouth 3 (three) times daily. 90 capsule 0  . PHENYTOIN SODIUM EXTENDED 100 MG PO CAPS Oral Take 3 capsules (300 mg total) by mouth at bedtime. 90 capsule 0    BP 139/87  Pulse 91  Temp 98 F (36.7 C) (Oral)  Resp 18  SpO2 99%  Physical Exam  Nursing note and vitals reviewed. Constitutional: He appears well-developed and well-nourished.  HENT:  Head: Normocephalic and atraumatic.  Right Ear: Tympanic membrane, external ear and ear canal normal.  Left Ear: Tympanic membrane, external ear and ear canal normal.  Nose: Nose normal. No mucosal edema.  Mouth/Throat: Uvula is midline. Mucous membranes are not dry. Posterior oropharyngeal erythema present. No oropharyngeal exudate, posterior oropharyngeal edema or tonsillar abscesses.  Eyes: Conjunctivae are normal. Right eye exhibits no discharge. Left eye exhibits no discharge.  Neck: Normal range of motion. Neck supple.  Cardiovascular: Normal rate, regular rhythm and normal heart sounds.   Pulmonary/Chest: Effort normal. No respiratory distress. He has wheezes (diffuse expiratory). He has no rales. He exhibits tenderness (anterior, mild).  Abdominal: Soft. There is no  tenderness.  Neurological: He is alert.  Skin: Skin is warm and dry.  Psychiatric: He has a normal mood and affect.    ED Course  Procedures (including critical care time)  Labs Reviewed - No data to display No results found.   1. Bronchitis     1:10 PM Patient seen  and examined. Medications ordered.   Vital signs reviewed and are as follows: Filed Vitals:   12/16/11 1251  BP: 139/87  Pulse: 91  Temp: 98 F (36.7 C)  Resp: 18   Patient improved after breathing treatment. Re-exam: wheezing completely resolved. Will give prednisone, as this has helped patient in past.   Patient counseled on use of albuterol HFA.  Told to use 1-2 puffs q 4 hours as needed for SOB.  Urged to return with high fever, worsening SOB, other concerns. Patient verbalizes understanding and agrees with plan.   MDM  Patient with likely viral bronchitis. No concern for PNA given normal lung exam. Wheezing improved with treatment. Antibiotics not indicated. Conservative therapy indicated.  Do not suspect pneumothorax, PE.          Renne Crigler, [place] 12/16/11 2143

## 2011-12-16 NOTE — ED Notes (Signed)
resp called for breathing treatment

## 2011-12-16 NOTE — ED Notes (Signed)
Resp at pt bedside

## 2011-12-16 NOTE — ED Notes (Signed)
Pt reports sore throat, shortness of breath, cough x 1 day. States hx of bronchitis- states feels the same. Hx of asthma- has not used inhaler.

## 2011-12-17 ENCOUNTER — Emergency Department (HOSPITAL_COMMUNITY)
Admission: EM | Admit: 2011-12-17 | Discharge: 2011-12-18 | Disposition: A | Discharge status: Home / Self Care | Payer: Self-pay | Attending: Emergency Medicine | Admitting: Emergency Medicine

## 2011-12-17 ENCOUNTER — Encounter (HOSPITAL_COMMUNITY): Payer: Self-pay | Admitting: Emergency Medicine

## 2011-12-17 DIAGNOSIS — J45901 Unspecified asthma with (acute) exacerbation: Secondary | ICD-10-CM | POA: Insufficient documentation

## 2011-12-17 DIAGNOSIS — F17200 Nicotine dependence, unspecified, uncomplicated: Secondary | ICD-10-CM | POA: Insufficient documentation

## 2011-12-17 DIAGNOSIS — J45909 Unspecified asthma, uncomplicated: Secondary | ICD-10-CM

## 2011-12-17 NOTE — ED Provider Notes (Signed)
Medical screening examination/treatment/procedure(s) were performed by non-physician practitioner and as supervising physician I was immediately available for consultation/collaboration.   Shawnia Vizcarrondo M Ashlen Kiger, MD 12/17/11 2132 

## 2011-12-17 NOTE — ED Notes (Signed)
Pt c/o difficulty breathing for 2 days, here yesterday for same, MDI not controlling difficulty breathing. Pt states he is also feeling anxious.

## 2011-12-18 MED ORDER — IPRATROPIUM BROMIDE 0.02 % IN SOLN
0.5000 mg | Freq: Once | RESPIRATORY_TRACT | Status: AC
  Administered 2011-12-18: 0.5 mg via RESPIRATORY_TRACT
  Filled 2011-12-18: qty 2.5

## 2011-12-18 MED ORDER — PHENYTOIN SODIUM EXTENDED 100 MG PO CAPS: 300.0000 mg | ORAL_CAPSULE | Freq: Every day | ORAL | Status: DC

## 2011-12-18 MED ORDER — ALBUTEROL SULFATE (5 MG/ML) 0.5% IN NEBU
5.0000 mg | INHALATION_SOLUTION | Freq: Once | RESPIRATORY_TRACT | Status: AC
  Administered 2011-12-18: 5 mg via RESPIRATORY_TRACT
  Filled 2011-12-18 (×2): qty 0.5

## 2011-12-18 MED ORDER — ALBUTEROL SULFATE (5 MG/ML) 0.5% IN NEBU
5.0000 mg | INHALATION_SOLUTION | Freq: Once | RESPIRATORY_TRACT | Status: AC
  Administered 2011-12-18: 5 mg via RESPIRATORY_TRACT
  Filled 2011-12-18: qty 1

## 2011-12-18 NOTE — ED Provider Notes (Signed)
History     CSN: [id number]  Arrival date & time 12/17/11  2319   First MD Initiated Contact with Patient 12/18/11 0013      Chief Complaint  Patient presents with  . Asthma    (Consider location/radiation/quality/duration/timing/severity/associated sxs/prior treatment) HPI Comments: Patient with history of asthma presents today with a chief complaint of shortness of breath and chest tightness.  Symptoms have been present for three days.  Symptoms gradually worsening.  He was seen in the ED 2 days ago for the same.  At that time he was given a breathing treatment and discharged home with a prescription for Prednisone, which he has been taking.  He reports that he has been using his Albuterol inhaler, but does not feel that it helped.  He currently smokes 0.5 packs of cigarettes/day.  He has never had to be intubated or hospitalized for his asthma.    Patient is a 26 y.o. male presenting with shortness of breath. The history is provided by the patient.  Shortness of Breath  Associated symptoms include sore throat, cough, shortness of breath and wheezing. Pertinent negatives include no chest pain, no fever and no rhinorrhea. Associated symptoms comments: No fever or chills..    Past Medical History  Diagnosis Date  . Asthma   . Seizure     History reviewed. No pertinent past surgical history.  No family history on file.  History  Substance Use Topics  . Smoking status: Current Everyday Smoker  . Smokeless tobacco: Not on file  . Alcohol Use: Yes     occasional      Review of Systems  Constitutional: Negative for fever and chills.  HENT: Positive for sore throat. Negative for rhinorrhea.   Respiratory: Positive for cough, chest tightness, shortness of breath and wheezing.   Cardiovascular: Negative for chest pain.  Neurological: Negative for dizziness, syncope and light-headedness.  All other systems reviewed and are negative.    Allergies  Review of patient's  allergies indicates no known allergies.  Home Medications   Current Outpatient Rx  Name Route Sig Dispense Refill  . PHENYTOIN SODIUM EXTENDED 100 MG PO CAPS Oral Take 1 capsule (100 mg total) by mouth 3 (three) times daily. 90 capsule 0  . PHENYTOIN SODIUM EXTENDED 100 MG PO CAPS Oral Take 3 capsules (300 mg total) by mouth at bedtime. 90 capsule 0  . PREDNISONE 20 MG PO TABS Oral Take 2 tablets (40 mg total) by mouth daily. 10 tablet 0    BP 121/74  Pulse 90  Temp 98.1 F (36.7 C) (Oral)  Resp 16  Ht 5\' 9"  (1.753 m)  Wt 135 lb (61.236 kg)  BMI 19.94 kg/m2  SpO2 95%  Physical Exam  Nursing note and vitals reviewed. Constitutional: He appears well-developed and well-nourished. No distress.  HENT:  Head: Normocephalic and atraumatic.  Mouth/Throat: Oropharynx is clear and moist.  Neck: Normal range of motion.  Cardiovascular: Normal rate, regular rhythm and normal heart sounds.   Pulmonary/Chest: No accessory muscle usage. Not tachypneic. No respiratory distress. He has wheezes. He has no rhonchi. He has no rales.       Patient able to speak in full sentences without difficulty. Diffuse expiratory wheezing.  Neurological: He is alert.  Skin: Skin is warm and dry. He is not diaphoretic.  Psychiatric: He has a normal mood and affect.    ED Course  Procedures (including critical care time)  Labs Reviewed - No data to display No results found.  No diagnosis found.  1:50 AM Reassessed patient.  He reports that his shortness of breath and wheezing have improved, but he continues to have tightness in his chest.  3:04 AM Reassessed patient.  Patient states that his symptoms have improved.  Lungs CTAB.  MDM  Patient presenting today with an asthma exacerbation.  Symptoms improved after two breathing treatments.  Pulse ox 98 on RA after breathing treatments.  No respiratory distress.  Patient given prescription for Prednisone yesterday when he was seen in the ED.           Pascal Lux [place], PA-C 12/19/11 2300

## 2011-12-18 NOTE — ED Notes (Signed)
Pt is requesting Dilantin for his seizures. Pt stated that he does not have insurance and was denied disability. Pt then stated that he does not have any money and wants hospital to supply dilantin.

## 2011-12-18 NOTE — ED Notes (Signed)
Pt states he has a sore throat and wants lozenges. Ice water given to calm throat. MD notified.

## 2011-12-18 NOTE — ED Notes (Signed)
Pt stated pressure when breathing in.

## 2011-12-18 NOTE — ED Notes (Signed)
MD at bedside. 

## 2011-12-23 NOTE — ED Provider Notes (Signed)
Medical screening examination/treatment/procedure(s) were performed by non-physician practitioner and as supervising physician I was immediately available for consultation/collaboration.  Olivia Mackie, MD 12/23/11 [phone]

## 2012-03-24 ENCOUNTER — Encounter (HOSPITAL_COMMUNITY): Payer: Self-pay | Admitting: Family Medicine

## 2012-03-24 ENCOUNTER — Emergency Department (HOSPITAL_COMMUNITY)
Admission: EM | Admit: 2012-03-24 | Discharge: 2012-03-24 | Disposition: A | Discharge status: Home / Self Care | Payer: Self-pay | Attending: Emergency Medicine | Admitting: Emergency Medicine

## 2012-03-24 DIAGNOSIS — Z79899 Other long term (current) drug therapy: Secondary | ICD-10-CM | POA: Insufficient documentation

## 2012-03-24 DIAGNOSIS — Z760 Encounter for issue of repeat prescription: Secondary | ICD-10-CM | POA: Insufficient documentation

## 2012-03-24 DIAGNOSIS — G40909 Epilepsy, unspecified, not intractable, without status epilepticus: Secondary | ICD-10-CM | POA: Insufficient documentation

## 2012-03-24 DIAGNOSIS — F17200 Nicotine dependence, unspecified, uncomplicated: Secondary | ICD-10-CM | POA: Insufficient documentation

## 2012-03-24 DIAGNOSIS — J45909 Unspecified asthma, uncomplicated: Secondary | ICD-10-CM | POA: Insufficient documentation

## 2012-03-24 DIAGNOSIS — R569 Unspecified convulsions: Secondary | ICD-10-CM

## 2012-03-24 LAB — COMPREHENSIVE METABOLIC PANEL
ALT: 17 U/L (ref 0–53)
AST: 21 U/L (ref 0–37)
CO2: 24 mEq/L (ref 19–32)
Calcium: 9.6 mg/dL (ref 8.4–10.5)
Chloride: 103 mEq/L (ref 96–112)
GFR calc non Af Amer: >90 mL/min (ref >90)
Sodium: 138 mEq/L (ref 135–145)

## 2012-03-24 LAB — CBC WITH DIFFERENTIAL/PLATELET
Basophils Absolute: 0.1 10*3/uL (ref 0.0–0.1)
Lymphocytes Relative: 36 % (ref 12–46)
Neutro Abs: 6.1 10*3/uL (ref 1.7–7.7)
Neutrophils Relative %: 52 % (ref 43–77)
Platelets: 199 10*3/uL (ref 150–400)
RDW: 13.3 % (ref 11.5–15.5)
WBC: 11.7 10*3/uL — ABNORMAL HIGH (ref 4.0–10.5)

## 2012-03-24 MED ORDER — PHENYTOIN SODIUM EXTENDED 100 MG PO CAPS
100.0000 mg | ORAL_CAPSULE | Freq: Once | ORAL | Status: AC
  Administered 2012-03-24: 100 mg via ORAL
  Filled 2012-03-24: qty 1

## 2012-03-24 MED ORDER — PHENYTOIN SODIUM EXTENDED 100 MG PO CAPS: 100.0000 mg | ORAL_CAPSULE | Freq: Three times a day (TID) | ORAL | Status: DC

## 2012-03-24 MED ORDER — SODIUM CHLORIDE 0.9 % IV SOLN
Freq: Once | INTRAVENOUS | Status: AC
  Administered 2012-03-24: 22:00:00 via INTRAVENOUS

## 2012-03-24 NOTE — ED Notes (Signed)
Patient states that he has a history of seizures. States that he has had two episodes of "blacking out" today. States he has run out of his Dilantin this morning but was compliant with taking his medication.

## 2012-03-24 NOTE — ED Provider Notes (Addendum)
History     CSN: [id number]  Arrival date & time 03/24/12  2003   First MD Initiated Contact with Patient 03/24/12 2128      Chief Complaint  Patient presents with  . Loss of Consciousness    (Consider location/radiation/quality/duration/timing/severity/associated sxs/prior treatment) The history is provided by the patient.  Bruce Galvan is a 26 y.o. male hx of dilantin here with "blacking out". He ran out of his medicine this AM. Today, he has 3 episodes of "blacking out". He described it as a sudden blanking out that lasted several seconds. No post ictal period. No chest pain or SOB or prodromal symptoms. No tonic clonic jerks (his typical seizure). He states that he usually gets this prior to his actual seizures. He states that he has been compliant with meds until today. Has good appetite, no fever or vomiting. Has been drinking alcohol intermittently but denies drug use. Hasn't been following up with neurologist due to lack of insurance.    Past Medical History  Diagnosis Date  . Asthma   . Seizure     History reviewed. No pertinent past surgical history.  No family history on file.  History  Substance Use Topics  . Smoking status: Current Every Day Smoker -- 1.0 packs/day    Types: Cigarettes  . Smokeless tobacco: Not on file  . Alcohol Use: Yes     occasional      Review of Systems  Neurological:       Blacking out   All other systems reviewed and are negative.    Allergies  Review of patient's allergies indicates no known allergies.  Home Medications   Current Outpatient Rx  Name Route Sig Dispense Refill  . ALBUTEROL SULFATE HFA 108 (90 BASE) MCG/ACT IN AERS Inhalation Inhale 2 puffs into the lungs every 6 (six) hours as needed. Shortness of breath    . PHENYTOIN SODIUM EXTENDED 100 MG PO CAPS Oral Take 3 capsules (300 mg total) by mouth daily. 90 capsule 0    BP 146/89  Pulse 89  Temp 97.7 F (36.5 C) (Oral)  Resp 17  SpO2 100%  Physical  Exam  Nursing note and vitals reviewed. Constitutional: He is oriented to person, place, and time. He appears well-developed and well-nourished. No distress.  HENT:  Head: Normocephalic.  Mouth/Throat: Oropharynx is clear and moist.  Eyes: Conjunctivae normal are normal. Pupils are equal, round, and reactive to light.  Neck: Normal range of motion. Neck supple.  Cardiovascular: Normal rate, regular rhythm and normal heart sounds.   Pulmonary/Chest: Effort normal and breath sounds normal. No respiratory distress. He has no wheezes. He has no rales.  Abdominal: Soft. Bowel sounds are normal. He exhibits no distension. There is no tenderness.  Musculoskeletal: Normal range of motion. He exhibits no edema.  Neurological: He is alert and oriented to person, place, and time. No cranial nerve deficit. Coordination normal.       Not post ictal   Skin: Skin is warm and dry.  Psychiatric: He has a normal mood and affect. His behavior is normal. Judgment and thought content normal.    ED Course  Procedures (including critical care time)  Labs Reviewed  CBC WITH DIFFERENTIAL - Abnormal; Notable for the following:    WBC 11.7 (*)     Lymphs Abs 4.2 (*)     All other components within normal limits  COMPREHENSIVE METABOLIC PANEL - Abnormal; Notable for the following:    Glucose, Bld 112 (*)  All other components within normal limits  ETHANOL  PHENYTOIN LEVEL, TOTAL   No results found.   No diagnosis found.   Date: 03/24/2012  Rate: 91  Rhythm: normal sinus rhythm  QRS Axis: normal  Intervals: normal  ST/T Wave abnormalities: early repolarization  Conduction Disutrbances:none  Narrative Interpretation: borderline LVH  Old EKG Reviewed: unchanged    MDM  Bruce Galvan is a 26 y.o. male here with blacking out, likely small seizures. Will check dilantin level, basic labs. EKG unchanged from prior and not concerning for ACS. Since he had this before and his neuro exam is nl, will  not need CT head for now. Will give him PO dilantin pending level.   11:07 PM Labs nl. Dilantin level pending. But patient wants to go. He refused IV dilantin. I loaded him with PO dilantin. I asked him to f/u with neuro. Return precautions given. I will f/u the dilantin result. If elevated will need to call him back to be re evaluated. It is expected to be low and he has been loaded with dilantin already.        Richardean Canal, MD 03/24/12 2308  Richardean Canal, MD 03/24/12 2312   Dilantin level low as expected. Patient not compliant with meds. He was loaded last night and I gave him dilantin 100mg  TID. I recommended that he f/u with neuro to adjust his meds.   Richardean Canal, MD 03/25/12 [phone]

## 2012-03-24 NOTE — ED Notes (Signed)
Pt c/o pain to IV site. IV running without difficulty, no redness or swelling noted. rate slowed and pt reports pain improved. Will continue to monitor IV site for changes.

## 2012-04-04 ENCOUNTER — Emergency Department (HOSPITAL_COMMUNITY)
Admission: EM | Admit: 2012-04-04 | Discharge: 2012-04-04 | Disposition: A | Discharge status: Home / Self Care | Payer: Self-pay | Attending: Emergency Medicine | Admitting: Emergency Medicine

## 2012-04-04 ENCOUNTER — Encounter (HOSPITAL_COMMUNITY): Payer: Self-pay | Admitting: Emergency Medicine

## 2012-04-04 DIAGNOSIS — F17200 Nicotine dependence, unspecified, uncomplicated: Secondary | ICD-10-CM | POA: Insufficient documentation

## 2012-04-04 DIAGNOSIS — R110 Nausea: Secondary | ICD-10-CM

## 2012-04-04 DIAGNOSIS — Z79899 Other long term (current) drug therapy: Secondary | ICD-10-CM | POA: Insufficient documentation

## 2012-04-04 DIAGNOSIS — G40909 Epilepsy, unspecified, not intractable, without status epilepticus: Secondary | ICD-10-CM | POA: Insufficient documentation

## 2012-04-04 DIAGNOSIS — R062 Wheezing: Secondary | ICD-10-CM | POA: Insufficient documentation

## 2012-04-04 DIAGNOSIS — J45909 Unspecified asthma, uncomplicated: Secondary | ICD-10-CM | POA: Insufficient documentation

## 2012-04-04 MED ORDER — ONDANSETRON 4 MG PO TBDP
4.0000 mg | ORAL_TABLET | Freq: Once | ORAL | Status: AC
  Administered 2012-04-04: 4 mg via ORAL
  Filled 2012-04-04: qty 1

## 2012-04-04 MED ORDER — ALBUTEROL SULFATE HFA 108 (90 BASE) MCG/ACT IN AERS: 1.0000 | INHALATION_SPRAY | Freq: Four times a day (QID) | RESPIRATORY_TRACT | Status: DC | PRN

## 2012-04-04 NOTE — ED Notes (Signed)
Pt states that he has been throwing up since last night.  States he has thrown up 3 or 4 times.  Also states his asthma is "acting up".  Pt is in no obvious distress.  97% on RA.  Denies diarrhea.

## 2012-04-04 NOTE — ED Provider Notes (Signed)
History     CSN: [id number]  Arrival date & time 04/04/12  1210   First MD Initiated Contact with Patient 04/04/12 1307      Chief Complaint  Patient presents with  . Nausea  . Emesis    (Consider location/radiation/quality/duration/timing/severity/associated sxs/prior treatment) HPI Comments: Pt states that he was just able to eat a honeybun and drink a soda and he is tolerating it;pt states that he has been also have problems with wheezing and he is out of his inhaler:pt states that he used a friends inhaler this morning:pt denies fever or cough  Patient is a 26 y.o. male presenting with vomiting. The history is provided by the patient. No language interpreter was used.  Emesis  This is a new problem. The current episode started yesterday. The problem occurs 5 to 10 times per day. The problem has been gradually improving. There has been no fever. Pertinent negatives include no abdominal pain, no diarrhea and no fever.    Past Medical History  Diagnosis Date  . Asthma   . Seizure     No past surgical history on file.  No family history on file.  History  Substance Use Topics  . Smoking status: Current Every Day Smoker -- 1.0 packs/day    Types: Cigarettes  . Smokeless tobacco: Not on file  . Alcohol Use: Yes     Comment: occasional      Review of Systems  Constitutional: Negative for fever.  Respiratory: Positive for wheezing.   Cardiovascular: Negative.   Gastrointestinal: Positive for vomiting. Negative for abdominal pain and diarrhea.  Neurological: Negative.     Allergies  Review of patient's allergies indicates no known allergies.  Home Medications   Current Outpatient Rx  Name  Route  Sig  Dispense  Refill  . ALBUTEROL SULFATE HFA 108 (90 BASE) MCG/ACT IN AERS   Inhalation   Inhale 2 puffs into the lungs every 6 (six) hours as needed. Shortness of breath         . BISMUTH SUBSALICYLATE 262 MG/15ML PO SUSP   Oral   Take 15 mLs by mouth every 6  (six) hours as needed. For stomach         . PHENYTOIN SODIUM EXTENDED 100 MG PO CAPS   Oral   Take 100 mg by mouth 3 (three) times daily.           BP 138/86  Pulse 100  Temp 97.6 F (36.4 C) (Oral)  Resp 20  SpO2 97%  Physical Exam  Nursing note and vitals reviewed. Constitutional: He is oriented to person, place, and time. He appears well-developed and well-nourished.  HENT:  Head: Normocephalic and atraumatic.  Cardiovascular: Normal rate and regular rhythm.   Pulmonary/Chest: Effort normal and breath sounds normal.  Abdominal: Soft. Bowel sounds are normal. There is no tenderness. There is no rebound.  Musculoskeletal: Normal range of motion.  Neurological: He is alert and oriented to person, place, and time.  Skin: Skin is warm and dry.  Psychiatric: He has a normal mood and affect.    ED Course  Procedures (including critical care time)  Labs Reviewed - No data to display No results found.   1. Nausea   2. Asthma       MDM  Pt keeping down fluids:will give a script for inhaler:pt not needing treatment at this time as is not wheezing        Teressa Lower, NP 04/04/12 1503

## 2012-04-04 NOTE — ED Provider Notes (Signed)
Medical screening examination/treatment/procedure(s) were performed by non-physician practitioner and as supervising physician I was immediately available for consultation/collaboration.   Shelda Jakes, MD 04/04/12 [phone]

## 2012-04-09 ENCOUNTER — Emergency Department (HOSPITAL_COMMUNITY)
Admission: EM | Admit: 2012-04-09 | Discharge: 2012-04-09 | Disposition: A | Discharge status: Home / Self Care | Payer: Self-pay | Attending: Emergency Medicine | Admitting: Emergency Medicine

## 2012-04-09 ENCOUNTER — Encounter (HOSPITAL_COMMUNITY): Payer: Self-pay | Admitting: Emergency Medicine

## 2012-04-09 DIAGNOSIS — F419 Anxiety disorder, unspecified: Secondary | ICD-10-CM

## 2012-04-09 DIAGNOSIS — Z79899 Other long term (current) drug therapy: Secondary | ICD-10-CM | POA: Insufficient documentation

## 2012-04-09 DIAGNOSIS — J45909 Unspecified asthma, uncomplicated: Secondary | ICD-10-CM | POA: Insufficient documentation

## 2012-04-09 DIAGNOSIS — F411 Generalized anxiety disorder: Secondary | ICD-10-CM | POA: Insufficient documentation

## 2012-04-09 DIAGNOSIS — M898X1 Other specified disorders of bone, shoulder: Secondary | ICD-10-CM

## 2012-04-09 DIAGNOSIS — T07XXXA Unspecified multiple injuries, initial encounter: Secondary | ICD-10-CM

## 2012-04-09 DIAGNOSIS — F17200 Nicotine dependence, unspecified, uncomplicated: Secondary | ICD-10-CM | POA: Insufficient documentation

## 2012-04-09 DIAGNOSIS — R569 Unspecified convulsions: Secondary | ICD-10-CM | POA: Insufficient documentation

## 2012-04-09 DIAGNOSIS — IMO0002 Reserved for concepts with insufficient information to code with codable children: Secondary | ICD-10-CM | POA: Insufficient documentation

## 2012-04-09 MED ORDER — LORAZEPAM 1 MG PO TABS: 1.0000 mg | ORAL_TABLET | Freq: Three times a day (TID) | ORAL | Status: DC | PRN

## 2012-04-09 MED ORDER — OXYCODONE-ACETAMINOPHEN 5-325 MG PO TABS: 1.0000 | ORAL_TABLET | Freq: Once | ORAL | Status: DC

## 2012-04-09 MED ORDER — IBUPROFEN 800 MG PO TABS
800.0000 mg | ORAL_TABLET | Freq: Once | ORAL | Status: AC
  Administered 2012-04-09: 800 mg via ORAL
  Filled 2012-04-09: qty 1
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
## 2012-04-10 NOTE — ED Provider Notes (Signed)
Medical screening examination/treatment/procedure(s) were performed by non-physician practitioner and as supervising physician I was immediately available for consultation/collaboration.  Harvest Deist, MD 04/10/12 1522 

## 2012-07-09 ENCOUNTER — Emergency Department (HOSPITAL_COMMUNITY): Payer: Self-pay

## 2012-07-09 ENCOUNTER — Emergency Department (HOSPITAL_COMMUNITY)
Admission: EM | Admit: 2012-07-09 | Discharge: 2012-07-09 | Disposition: A | Discharge status: Home / Self Care | Payer: Self-pay | Attending: Emergency Medicine | Admitting: Emergency Medicine

## 2012-07-09 DIAGNOSIS — Y93G3 Activity, cooking and baking: Secondary | ICD-10-CM | POA: Insufficient documentation

## 2012-07-09 DIAGNOSIS — F17200 Nicotine dependence, unspecified, uncomplicated: Secondary | ICD-10-CM | POA: Insufficient documentation

## 2012-07-09 DIAGNOSIS — Z23 Encounter for immunization: Secondary | ICD-10-CM | POA: Insufficient documentation

## 2012-07-09 DIAGNOSIS — Z9114 Patient's other noncompliance with medication regimen: Secondary | ICD-10-CM

## 2012-07-09 DIAGNOSIS — R569 Unspecified convulsions: Secondary | ICD-10-CM

## 2012-07-09 DIAGNOSIS — Z79899 Other long term (current) drug therapy: Secondary | ICD-10-CM | POA: Insufficient documentation

## 2012-07-09 DIAGNOSIS — W1809XA Striking against other object with subsequent fall, initial encounter: Secondary | ICD-10-CM | POA: Insufficient documentation

## 2012-07-09 DIAGNOSIS — Z9119 Patient's noncompliance with other medical treatment and regimen: Secondary | ICD-10-CM | POA: Insufficient documentation

## 2012-07-09 DIAGNOSIS — Y92009 Unspecified place in unspecified non-institutional (private) residence as the place of occurrence of the external cause: Secondary | ICD-10-CM | POA: Insufficient documentation

## 2012-07-09 DIAGNOSIS — G40909 Epilepsy, unspecified, not intractable, without status epilepticus: Secondary | ICD-10-CM | POA: Insufficient documentation

## 2012-07-09 DIAGNOSIS — Z91199 Patient's noncompliance with other medical treatment and regimen due to unspecified reason: Secondary | ICD-10-CM | POA: Insufficient documentation

## 2012-07-09 DIAGNOSIS — S0101XA Laceration without foreign body of scalp, initial encounter: Secondary | ICD-10-CM

## 2012-07-09 DIAGNOSIS — J45909 Unspecified asthma, uncomplicated: Secondary | ICD-10-CM | POA: Insufficient documentation

## 2012-07-09 DIAGNOSIS — S0100XA Unspecified open wound of scalp, initial encounter: Secondary | ICD-10-CM | POA: Insufficient documentation

## 2012-07-09 LAB — POCT I-STAT, CHEM 8
BUN: 10 mg/dL (ref 6–23)
Creatinine, Ser: 1.1 mg/dL (ref 0.50–1.35)
Sodium: 139 mEq/L (ref 135–145)
TCO2: 22 mmol/L (ref 0–100)

## 2012-07-09 MED ORDER — HYDROCODONE-ACETAMINOPHEN 5-325 MG PO TABS
1.0000 | ORAL_TABLET | Freq: Four times a day (QID) | ORAL | Status: DC | PRN
Start: 2012-07-09 — End: 2013-02-05

## 2012-07-09 MED ORDER — PHENYTOIN SODIUM EXTENDED 100 MG PO CAPS: 100.0000 mg | ORAL_CAPSULE | Freq: Three times a day (TID) | ORAL | Status: DC

## 2012-07-09 MED ORDER — ALBUTEROL SULFATE HFA 108 (90 BASE) MCG/ACT IN AERS: 1.0000 | INHALATION_SPRAY | Freq: Four times a day (QID) | RESPIRATORY_TRACT | Status: DC | PRN

## 2012-07-09 MED ORDER — MORPHINE SULFATE 4 MG/ML IJ SOLN
4.0000 mg | Freq: Once | INTRAMUSCULAR | Status: AC
  Administered 2012-07-09: 4 mg via INTRAVENOUS
  Filled 2012-07-09: qty 1

## 2012-07-09 MED ORDER — LORAZEPAM 2 MG/ML IJ SOLN
1.0000 mg | Freq: Once | INTRAMUSCULAR | Status: AC
  Administered 2012-07-09: 1 mg via INTRAVENOUS
  Filled 2012-07-09: qty 1

## 2012-07-09 MED ORDER — PHENYTOIN SODIUM EXTENDED 100 MG PO CAPS
400.0000 mg | ORAL_CAPSULE | Freq: Once | ORAL | Status: AC
  Administered 2012-07-09: 400 mg via ORAL
  Filled 2012-07-09: qty 4

## 2012-07-09 MED ORDER — ONDANSETRON HCL 4 MG/2ML IJ SOLN
4.0000 mg | Freq: Once | INTRAMUSCULAR | Status: AC
  Administered 2012-07-09: 4 mg via INTRAVENOUS
  Filled 2012-07-09: qty 2

## 2012-07-09 MED ORDER — SODIUM CHLORIDE 0.9 % IV SOLN
1000.0000 mg | Freq: Once | INTRAVENOUS | Status: DC
  Filled 2012-07-09: qty 20

## 2012-07-09 MED ORDER — TETANUS-DIPHTH-ACELL PERTUSSIS 5-2.5-18.5 LF-MCG/0.5 IM SUSP
0.5000 mL | Freq: Once | INTRAMUSCULAR | Status: AC
  Administered 2012-07-09: 0.5 mL via INTRAMUSCULAR
  Filled 2012-07-09: qty 0.5

## 2012-07-09 NOTE — ED Provider Notes (Signed)
Medical screening examination/treatment/procedure(s) were performed by non-physician practitioner and as supervising physician I was immediately available for consultation/collaboration.   Gwyneth Sprout, MD 07/09/12 [phone]

## 2012-07-09 NOTE — ED Notes (Signed)
Bed:WA18<BR> Expected date:<BR> Expected time:<BR> Means of arrival:<BR> Comments:<BR> EMS

## 2012-07-09 NOTE — ED Notes (Signed)
Pt sts he fell and struck head on stove.pt has not had Dilantin in one month. Pt has laceration on back of head with no deficits.pt Alert and orientated.pt requesting not to have staples or needles.
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
## 2012-07-29 ENCOUNTER — Encounter (HOSPITAL_COMMUNITY): Payer: Self-pay | Admitting: Emergency Medicine

## 2012-07-29 ENCOUNTER — Emergency Department (INDEPENDENT_AMBULATORY_CARE_PROVIDER_SITE_OTHER)
Admission: EM | Admit: 2012-07-29 | Discharge: 2012-07-29 | Disposition: A | Discharge status: Home / Self Care | Payer: Self-pay | Source: Home / Self Care | Attending: Emergency Medicine | Admitting: Emergency Medicine

## 2012-07-29 DIAGNOSIS — Z4802 Encounter for removal of sutures: Secondary | ICD-10-CM

## 2012-07-29 NOTE — ED Provider Notes (Signed)
Medical screening examination/treatment/procedure(s) were performed by non-physician practitioner and as supervising physician I was immediately available for consultation/collaboration.  Leslee Home, M.D.  Reuben Likes, MD 07/29/12 2101

## 2012-07-29 NOTE — ED Notes (Signed)
Pt here for removal of staples from the back of his head.  Pt voices no concerns at this time.

## 2012-07-29 NOTE — ED Provider Notes (Signed)
History     CSN: [id number]  Arrival date & time 07/29/12  1628   First MD Initiated Contact with Patient 07/29/12 1708      Chief Complaint  Patient presents with  . Suture / Staple Removal    staple removal only.     (Consider location/radiation/quality/duration/timing/severity/associated sxs/prior treatment) HPI Comments: 5 Staples placed 07/08/12  Patient is a 27 y.o. male presenting with suture removal. The history is provided by the patient.  Suture / Staple Removal  The sutures were placed more than 14 days ago. There has been no treatment since the wound repair. There has been no drainage from the wound. There is no redness present. There is no swelling present. The pain has no pain.    Past Medical History  Diagnosis Date  . Asthma   . Seizure     History reviewed. No pertinent past surgical history.  History reviewed. No pertinent family history.  History  Substance Use Topics  . Smoking status: Current Every Day Smoker -- 1.00 packs/day    Types: Cigarettes  . Smokeless tobacco: Not on file  . Alcohol Use: Yes     Comment: occasional      Review of Systems  Constitutional: Negative for fever and chills.  Skin: Negative for color change.    Allergies  Review of patient's allergies indicates no known allergies.  Home Medications   Current Outpatient Rx  Name  Route  Sig  Dispense  Refill  . phenytoin (DILANTIN) 100 MG ER capsule   Oral   Take 100 mg by mouth 3 (three) times daily.         . phenytoin (DILANTIN) 100 MG ER capsule   Oral   Take 1 capsule (100 mg total) by mouth 3 (three) times daily.   90 capsule   3   . albuterol (PROVENTIL HFA;VENTOLIN HFA) 108 (90 BASE) MCG/ACT inhaler   Inhalation   Inhale 1-2 puffs into the lungs every 6 (six) hours as needed. Shortness of breath         . albuterol (PROVENTIL HFA;VENTOLIN HFA) 108 (90 BASE) MCG/ACT inhaler   Inhalation   Inhale 1-2 puffs into the lungs every 6 (six) hours as  needed for wheezing.   1 Inhaler   0   . HYDROcodone-acetaminophen (NORCO/VICODIN) 5-325 MG per tablet   Oral   Take 1 tablet by mouth every 6 (six) hours as needed for pain.   15 tablet   0     BP 142/79  Pulse 74  Temp(Src) 98.7 F (37.1 C) (Oral)  Resp 18  SpO2 100%  Physical Exam  Constitutional: He appears well-developed and well-nourished. No distress.  Skin: Skin is warm and dry.  Laceration to posterior scalp well-healed. No erythema, warmth, drainage or tenderness    ED Course  SUTURE REMOVAL Date/Time: 07/29/2012 5:11 PM Performed by: Cathlyn Parsons Authorized by: Leslee Home C Consent: Verbal consent obtained. written consent not obtained. Consent given by: patient Patient understanding: patient states understanding of the procedure being performed Patient identity confirmed: verbally with patient and arm band Body area: head/neck Location details: scalp Wound Appearance: clean Staples Removed: 5   (including critical care time)  Labs Reviewed - No data to display No results found.   1. Encounter for staple removal       MDM          Cathlyn Parsons, NP 07/29/12 1712

## 2013-02-05 ENCOUNTER — Emergency Department (HOSPITAL_COMMUNITY)
Admission: EM | Admit: 2013-02-05 | Discharge: 2013-02-05 | Disposition: A | Discharge status: Home / Self Care | Payer: Self-pay | Attending: Emergency Medicine | Admitting: Emergency Medicine

## 2013-02-05 ENCOUNTER — Encounter (HOSPITAL_COMMUNITY): Payer: Self-pay | Admitting: Emergency Medicine

## 2013-02-05 DIAGNOSIS — B9789 Other viral agents as the cause of diseases classified elsewhere: Secondary | ICD-10-CM

## 2013-02-05 DIAGNOSIS — G40909 Epilepsy, unspecified, not intractable, without status epilepticus: Secondary | ICD-10-CM | POA: Insufficient documentation

## 2013-02-05 DIAGNOSIS — J069 Acute upper respiratory infection, unspecified: Secondary | ICD-10-CM | POA: Insufficient documentation

## 2013-02-05 DIAGNOSIS — F17200 Nicotine dependence, unspecified, uncomplicated: Secondary | ICD-10-CM | POA: Insufficient documentation

## 2013-02-05 DIAGNOSIS — J45901 Unspecified asthma with (acute) exacerbation: Secondary | ICD-10-CM | POA: Insufficient documentation

## 2013-02-05 DIAGNOSIS — J45909 Unspecified asthma, uncomplicated: Secondary | ICD-10-CM

## 2013-02-05 DIAGNOSIS — Z79899 Other long term (current) drug therapy: Secondary | ICD-10-CM | POA: Insufficient documentation

## 2013-02-05 MED ORDER — PREDNISONE 20 MG PO TABS
60.0000 mg | ORAL_TABLET | Freq: Once | ORAL | Status: AC
  Administered 2013-02-05: 60 mg via ORAL
  Filled 2013-02-05: qty 3

## 2013-02-05 MED ORDER — BENZONATATE 100 MG PO CAPS: 100.0000 mg | ORAL_CAPSULE | Freq: Three times a day (TID) | ORAL | Status: DC

## 2013-02-05 MED ORDER — PREDNISONE (PAK) 10 MG PO TABS: 10.0000 mg | ORAL_TABLET | Freq: Every day | ORAL | Status: DC

## 2013-02-05 MED ORDER — ALBUTEROL SULFATE HFA 108 (90 BASE) MCG/ACT IN AERS
2.0000 | INHALATION_SPRAY | Freq: Once | RESPIRATORY_TRACT | Status: AC
  Administered 2013-02-05: 2 via RESPIRATORY_TRACT
  Filled 2013-02-05: qty 6.7

## 2013-02-05 MED ORDER — ALBUTEROL SULFATE HFA 108 (90 BASE) MCG/ACT IN AERS: 1.0000 | INHALATION_SPRAY | Freq: Four times a day (QID) | RESPIRATORY_TRACT | Status: DC | PRN

## 2013-02-05 MED ORDER — ALBUTEROL SULFATE (5 MG/ML) 0.5% IN NEBU
5.0000 mg | INHALATION_SOLUTION | Freq: Once | RESPIRATORY_TRACT | Status: AC
  Administered 2013-02-05: 5 mg via RESPIRATORY_TRACT
  Filled 2013-02-05: qty 1

## 2013-02-05 MED ORDER — IPRATROPIUM BROMIDE 0.02 % IN SOLN
0.5000 mg | Freq: Once | RESPIRATORY_TRACT | Status: AC
  Administered 2013-02-05: 0.5 mg via RESPIRATORY_TRACT
  Filled 2013-02-05: qty 2.5

## 2013-02-05 NOTE — ED Notes (Signed)
Pt c/o productive cough x 2 days.  Also states hx of asthma.  No audible wheezing or SOB.

## 2013-02-05 NOTE — ED Provider Notes (Signed)
Medical screening examination/treatment/procedure(s) were performed by non-physician practitioner and as supervising physician I was immediately available for consultation/collaboration.    Gilda Crease, MD 02/05/13 [phone]

## 2013-02-05 NOTE — ED Provider Notes (Signed)
CSN: [id number]     Arrival date & time 02/05/13  1023 History   First MD Initiated Contact with Patient 02/05/13 1124     Chief Complaint  Patient presents with  . Cough  . Nasal Congestion   (Consider location/radiation/quality/duration/timing/severity/associated sxs/prior Treatment) HPI Comments: Patient with history of asthma states he has been sick for 2 days. He has had chills, body, cough productive of dark green sputum, shortness of breath, sore throat only with coughing, nasal congestion. He has been out of his albuterol for 1 day. He has a sick contact in his boyfriend who was sick with the same symptoms but is now better.  Patient is a 27 y.o. male presenting with cough. The history is provided by the patient.  Cough Associated symptoms: chills, myalgias, shortness of breath, sore throat and wheezing   Associated symptoms: no fever     Past Medical History  Diagnosis Date  . Asthma   . Seizure    No past surgical history on file. No family history on file. History  Substance Use Topics  . Smoking status: Current Every Day Smoker -- 1.00 packs/day    Types: Cigarettes  . Smokeless tobacco: Not on file  . Alcohol Use: Yes     Comment: occasional    Review of Systems  Constitutional: Positive for chills. Negative for fever.  HENT: Positive for congestion and sore throat.   Respiratory: Positive for cough, shortness of breath and wheezing.   Musculoskeletal: Positive for myalgias.    Allergies  Review of patient's allergies indicates no known allergies.  Home Medications   Current Outpatient Rx  Name  Route  Sig  Dispense  Refill  . albuterol (PROVENTIL HFA;VENTOLIN HFA) 108 (90 BASE) MCG/ACT inhaler   Inhalation   Inhale 1-2 puffs into the lungs every 6 (six) hours as needed. Shortness of breath         . Chlorphen-Pseudoephed-APAP (SINUS PAIN RELIEF MAX ST PO)   Oral   Take 1 tablet by mouth every 8 (eight) hours as needed (sinus issues).         Marland Kitchen  guaiFENesin (MUCINEX) 600 MG 12 hr tablet   Oral   Take 1,200 mg by mouth 2 (two) times daily.         . phenytoin (DILANTIN) 100 MG ER capsule   Oral   Take 300 mg by mouth daily.           BP 128/80  Pulse 88  Temp(Src) 98.4 F (36.9 C) (Oral)  Resp 18  SpO2 97% Physical Exam  Nursing note and vitals reviewed. Constitutional: He appears well-developed and well-nourished. No distress.  HENT:  Head: Normocephalic and atraumatic.  Nose: Right sinus exhibits no maxillary sinus tenderness and no frontal sinus tenderness. Left sinus exhibits no maxillary sinus tenderness and no frontal sinus tenderness.  Mouth/Throat: Uvula is midline. Mucous membranes are not dry. No edematous. Posterior oropharyngeal erythema present. No oropharyngeal exudate, posterior oropharyngeal edema or tonsillar abscesses.  Eyes: Conjunctivae are normal.  Neck: Normal range of motion. Neck supple.  Cardiovascular: Normal rate and regular rhythm.   Pulmonary/Chest: Effort normal. No stridor. No respiratory distress. He has wheezes. He has no rales.  Lymphadenopathy:    He has no cervical adenopathy.  Neurological: He is alert.  Skin: He is not diaphoretic.  Psychiatric: He has a normal mood and affect. His behavior is normal.    ED Course  Procedures (including critical care time) Labs Review Labs Reviewed - No  data to display Imaging Review No results found.   12:51 PM much improved after neb treatment. Patient reports improvement. Good air movement in all fields.. Mild persistent wheeze. No rales.  MDM   1. Viral respiratory illness   2. Asthma    Patient with 2 days of viral respiratory illness in the setting of asthma, having run out of his asthma medications. Patient given neb treatment and prednisone in the ED.  Improvement with neb treatment. I suspect this is a viral illness exacerbating his asthma. I doubt pneumonia or PE. Patient discharged with prednisone, albuterol, Tessalon Perles.   Discussed findings, treatment, and follow up  with patient.  Pt given return precautions.  Pt verbalizes understanding and agrees with plan.        Trixie Dredge, PA-C 02/05/13 1252

## 2013-02-06 ENCOUNTER — Emergency Department (HOSPITAL_COMMUNITY): Payer: Self-pay

## 2013-02-06 ENCOUNTER — Observation Stay (HOSPITAL_COMMUNITY)
Admission: EM | Admit: 2013-02-06 | Discharge: 2013-02-06 | Disposition: A | Discharge status: Home / Self Care | Payer: Self-pay | Attending: Emergency Medicine | Admitting: Emergency Medicine

## 2013-02-06 DIAGNOSIS — Z79899 Other long term (current) drug therapy: Secondary | ICD-10-CM | POA: Insufficient documentation

## 2013-02-06 DIAGNOSIS — J45901 Unspecified asthma with (acute) exacerbation: Principal | ICD-10-CM | POA: Insufficient documentation

## 2013-02-06 DIAGNOSIS — J988 Other specified respiratory disorders: Secondary | ICD-10-CM | POA: Insufficient documentation

## 2013-02-06 DIAGNOSIS — B9789 Other viral agents as the cause of diseases classified elsewhere: Secondary | ICD-10-CM | POA: Insufficient documentation

## 2013-02-06 DIAGNOSIS — F17200 Nicotine dependence, unspecified, uncomplicated: Secondary | ICD-10-CM | POA: Insufficient documentation

## 2013-02-06 MED ORDER — IBUPROFEN 800 MG PO TABS
800.0000 mg | ORAL_TABLET | Freq: Once | ORAL | Status: AC
  Administered 2013-02-06: 800 mg via ORAL
  Filled 2013-02-06: qty 1

## 2013-02-06 MED ORDER — MAGNESIUM SULFATE 40 MG/ML IJ SOLN
2.0000 g | Freq: Once | INTRAMUSCULAR | Status: AC
  Administered 2013-02-06: 2 g via INTRAVENOUS
  Filled 2013-02-06: qty 50

## 2013-02-06 MED ORDER — IPRATROPIUM BROMIDE 0.02 % IN SOLN
0.5000 mg | Freq: Once | RESPIRATORY_TRACT | Status: AC
  Administered 2013-02-06: 0.5 mg via RESPIRATORY_TRACT

## 2013-02-06 MED ORDER — IPRATROPIUM BROMIDE 0.02 % IN SOLN
RESPIRATORY_TRACT | Status: AC
  Administered 2013-02-06: 0.5 mg via RESPIRATORY_TRACT
  Filled 2013-02-06: qty 2.5

## 2013-02-06 MED ORDER — METHYLPREDNISOLONE SODIUM SUCC 125 MG IJ SOLR
125.0000 mg | Freq: Once | INTRAMUSCULAR | Status: DC
  Filled 2013-02-06: qty 2

## 2013-02-06 MED ORDER — ALBUTEROL (5 MG/ML) CONTINUOUS INHALATION SOLN
INHALATION_SOLUTION | RESPIRATORY_TRACT | Status: AC
  Administered 2013-02-06: 15 mg/h via RESPIRATORY_TRACT
  Filled 2013-02-06: qty 20

## 2013-02-06 MED ORDER — ALBUTEROL (5 MG/ML) CONTINUOUS INHALATION SOLN
15.0000 mg/h | INHALATION_SOLUTION | Freq: Once | RESPIRATORY_TRACT | Status: AC
Start: 2013-02-06 — End: 2013-02-06
  Administered 2013-02-06: 15 mg/h via RESPIRATORY_TRACT

## 2013-02-06 MED ORDER — LORAZEPAM 1 MG PO TABS
1.0000 mg | ORAL_TABLET | Freq: Once | ORAL | Status: AC
  Administered 2013-02-06: 1 mg via ORAL
  Filled 2013-02-06: qty 1
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
## 2013-02-06 NOTE — ED Provider Notes (Signed)
Medical screening examination/treatment/procedure(s) were performed by non-physician practitioner and as supervising physician I was immediately available for consultation/collaboration.   Junius Argyle, MD 02/06/13 1958

## 2013-02-06 NOTE — ED Provider Notes (Signed)
CSN: [id number]     Arrival date & time 02/06/13  0555 History   First MD Initiated Contact with Patient 02/06/13 0602     No chief complaint on file.  (Consider location/radiation/quality/duration/timing/severity/associated sxs/prior Treatment) HPI Comments: Patient with hx asthma and active smoker, seen yesterday at ED (by me) returns today for worsening condition.  Pt has been sick for 3 days with nasal congestion, sore throat, cough productive of green sputum, SOB, wheezing, body aches, and chills.  Pt has sick contact of boyfriend who was sick with same but is better.  Took prednisone at home but got worse with increased SOB and wheezing and returned for further treatment.    The history is provided by the patient.    Past Medical History  Diagnosis Date  . Asthma   . Seizure    No past surgical history on file. No family history on file. History  Substance Use Topics  . Smoking status: Current Every Day Smoker -- 1.00 packs/day    Types: Cigarettes  . Smokeless tobacco: Not on file  . Alcohol Use: Yes     Comment: occasional    Review of Systems  Constitutional: Positive for fever and chills.  HENT: Positive for congestion and sore throat. Negative for trouble swallowing.   Respiratory: Positive for cough, shortness of breath and wheezing.   Gastrointestinal: Positive for abdominal pain (with coughing).  Musculoskeletal: Positive for myalgias.    Allergies  Review of patient's allergies indicates no known allergies.  Home Medications   Current Outpatient Rx  Name  Route  Sig  Dispense  Refill  . albuterol (PROVENTIL HFA;VENTOLIN HFA) 108 (90 BASE) MCG/ACT inhaler   Inhalation   Inhale 1-2 puffs into the lungs every 6 (six) hours as needed. Shortness of breath         . albuterol (PROVENTIL HFA;VENTOLIN HFA) 108 (90 BASE) MCG/ACT inhaler   Inhalation   Inhale 1-2 puffs into the lungs every 6 (six) hours as needed for wheezing.   1 Inhaler   0   . benzonatate  (TESSALON) 100 MG capsule   Oral   Take 1 capsule (100 mg total) by mouth every 8 (eight) hours.   21 capsule   0   . Chlorphen-Pseudoephed-APAP (SINUS PAIN RELIEF MAX ST PO)   Oral   Take 1 tablet by mouth every 8 (eight) hours as needed (sinus issues).         Marland Kitchen guaiFENesin (MUCINEX) 600 MG 12 hr tablet   Oral   Take 1,200 mg by mouth 2 (two) times daily.         . phenytoin (DILANTIN) 100 MG ER capsule   Oral   Take 300 mg by mouth daily.          . predniSONE (STERAPRED UNI-PAK) 10 MG tablet   Oral   Take 1 tablet (10 mg total) by mouth daily. Day 1: take 6 tabs.  Day 2: 5 tabs  Day 3: 4 tabs  Day 4: 3 tabs  Day 5: 2 tabs  Day 6: 1 tab   21 tablet   0    SpO2 98% Physical Exam  Nursing note and vitals reviewed. Constitutional: He appears well-developed and well-nourished. No distress.  HENT:  Head: Normocephalic and atraumatic.  Neck: Neck supple.  Pulmonary/Chest: Effort normal. No respiratory distress. He has wheezes. He has no rales.  Neurological: He is alert.  Skin: He is not diaphoretic.    ED Course  Procedures (including critical  care time) Labs Review Labs Reviewed - No data to display Imaging Review Dg Chest 2 View  02/06/2013   *RADIOLOGY REPORT*  Clinical Data: Productive cough, shortness of breath, history of asthma and smoking  CHEST - 2 VIEW  Comparison: 03/13/2010; 11/10/2006  Findings: Grossly unchanged cardiac silhouette and mediastinal contours.  The lungs remain hyperexpanded with flattening the bile hemidiaphragms and mild diffuse thickening of the interstitium.  No focal airspace opacity.  No pleural effusion or pneumothorax.  No evidence of edema.  IMPRESSION: Hyperexpanded lungs without acute cardiopulmonary disease.   Original Report Authenticated By: Tacey Ruiz, MD    8:52 AM Lungs now CTAB, moving air well.  Pt complaining of feeling jittery, shaking then feeling hot, states he "feels weird."  Pt did receive a large amount of  albuterol, will given ativan PO for symptoms.    Filed Vitals:   02/06/13 1005  BP: 119/60  Pulse: 103  Temp:   Resp: 20     MDM   1. Asthma exacerbation   2. Viral respiratory illness    Patient with hx asthma currently with viral illness.  Pt was seen yesterday in fast track, improvement after 1 neb, prednisone given in ED - pt continued to decline and returned for further treatment.  Pt given one hour long neb with great improvement.  Pt not given steroids here because he was given 60mg  prednisone yesterday and then took 60mg  last night again.  Pt became very anxious after 15mg  albuterol, given ativan with great improvement.  CXR negative for pneumonia.  Pt d/c home without new prescriptions - prednisone and inhaler given yesterday.  Discussed result, findings, treatment, and follow up  with patient.  Pt given return precautions.  Pt verbalizes understanding and agrees with plan.       [employer], PA-C 02/06/13 [phone]

## 2013-02-06 NOTE — ED Notes (Signed)
Pt returns to ED for SOB after being unable to get relief from [facility] ( Albuterol inhaler) rx'ed earlier.

## 2013-04-23 ENCOUNTER — Emergency Department (HOSPITAL_COMMUNITY)
Admission: EM | Admit: 2013-04-23 | Discharge: 2013-04-23 | Disposition: A | Discharge status: Home / Self Care | Payer: Self-pay | Attending: Emergency Medicine | Admitting: Emergency Medicine

## 2013-04-23 ENCOUNTER — Encounter (HOSPITAL_COMMUNITY): Payer: Self-pay | Admitting: Emergency Medicine

## 2013-04-23 DIAGNOSIS — J45909 Unspecified asthma, uncomplicated: Secondary | ICD-10-CM | POA: Insufficient documentation

## 2013-04-23 DIAGNOSIS — Z79899 Other long term (current) drug therapy: Secondary | ICD-10-CM | POA: Insufficient documentation

## 2013-04-23 DIAGNOSIS — R569 Unspecified convulsions: Secondary | ICD-10-CM

## 2013-04-23 DIAGNOSIS — F17200 Nicotine dependence, unspecified, uncomplicated: Secondary | ICD-10-CM | POA: Insufficient documentation

## 2013-04-23 DIAGNOSIS — G40909 Epilepsy, unspecified, not intractable, without status epilepticus: Secondary | ICD-10-CM | POA: Insufficient documentation

## 2013-04-23 LAB — POCT I-STAT, CHEM 8
BUN: 16 mg/dL (ref 6–23)
Creatinine, Ser: 1.3 mg/dL (ref 0.50–1.35)
Sodium: 142 mEq/L (ref 135–145)
TCO2: 24 mmol/L (ref 0–100)

## 2013-04-23 MED ORDER — PHENYTOIN SODIUM EXTENDED 100 MG PO CAPS
300.0000 mg | ORAL_CAPSULE | Freq: Once | ORAL | Status: AC
  Administered 2013-04-23: 300 mg via ORAL
  Filled 2013-04-23: qty 3

## 2013-04-23 MED ORDER — PHENYTOIN SODIUM EXTENDED 100 MG PO CAPS: 300.0000 mg | ORAL_CAPSULE | Freq: Every day | ORAL | Status: DC

## 2013-04-23 NOTE — ED Notes (Signed)
Patient states that he had a seizure. Patient states that he experienced loss of consciousness, but denies incontinence. He states that he has been out of his medication almost 2 weeks and would like a prescription refill.

## 2013-04-23 NOTE — ED Provider Notes (Signed)
CSN: [id number]     Arrival date & time 04/23/13  1818 History   First MD Initiated Contact with Patient 04/23/13 1820     Chief Complaint  Patient presents with  . Seizures   (Consider location/radiation/quality/duration/timing/severity/associated sxs/prior Treatment) HPI Comments: Patient presents to the emergency department with chief complaint of seizure. He states that he experienced a seizure this afternoon. He's not postictal now. States that he has run out of his medications. He denies any associated symptoms at this time, no chest pain, shortness of breath, nausea, vomiting, diarrhea, or constipation. 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 He requests the resource guide.  The history is provided by the patient. No language interpreter was used.    Past Medical History  Diagnosis Date  . Asthma   . Seizure    History reviewed. No pertinent past surgical history. History reviewed. No pertinent family history. History  Substance Use Topics  . Smoking status: Current Every Day Smoker -- 1.00 packs/day    Types: Cigarettes  . Smokeless tobacco: Not on file  . Alcohol Use: Yes     Comment: occasional    Review of Systems  All other systems reviewed and are negative.    Allergies  Review of patient's allergies indicates no known allergies.  Home Medications   Current Outpatient Rx  Name  Route  Sig  Dispense  Refill  . albuterol (PROVENTIL HFA;VENTOLIN HFA) 108 (90 BASE) MCG/ACT inhaler   Inhalation   Inhale 1-2 puffs into the lungs every 6 (six) hours as needed. Shortness of breath         . benzonatate (TESSALON) 100 MG capsule   Oral   Take 1 capsule (100 mg total) by mouth every 8 (eight) hours.   21 capsule   0   . Chlorphen-Pseudoephed-APAP (SINUS PAIN RELIEF MAX ST PO)   Oral   Take 1 tablet by mouth every 8 (eight) hours as needed (sinus issues).         Marland Kitchen guaiFENesin (MUCINEX) 600 MG 12 hr tablet    Oral   Take 1,200 mg by mouth 2 (two) times daily.         . phenytoin (DILANTIN) 100 MG ER capsule   Oral   Take 300 mg by mouth daily.          . predniSONE (STERAPRED UNI-PAK) 10 MG tablet   Oral   Take 1 tablet (10 mg total) by mouth daily. Day 1: take 6 tabs.  Day 2: 5 tabs  Day 3: 4 tabs  Day 4: 3 tabs  Day 5: 2 tabs  Day 6: 1 tab   21 tablet   0    BP 142/92  Pulse 74  Resp 18  SpO2 98% Physical Exam  Nursing note and vitals reviewed. Constitutional: He is oriented to person, place, and time. He appears well-developed and well-nourished.  HENT:  Head: Normocephalic and atraumatic.  Right Ear: External ear normal.  Left Ear: External ear normal.  Nose: Nose normal.  Mouth/Throat: Oropharynx is clear and moist. No oropharyngeal exudate.  Eyes: Conjunctivae and EOM are normal. Pupils are equal, round, and reactive to light. Right eye exhibits no discharge. Left eye exhibits no discharge. No scleral icterus.  Neck: Normal range of motion. Neck supple. No JVD present.  Cardiovascular: Normal rate, regular rhythm, normal heart sounds and intact distal pulses.  Exam reveals no gallop and no friction rub.   No murmur  heard. Pulmonary/Chest: Effort normal and breath sounds normal. No respiratory distress. He has no wheezes. He has no rales. He exhibits no tenderness.  Abdominal: Soft. Bowel sounds are normal. He exhibits no distension and no mass. There is no tenderness. There is no rebound and no guarding.  Musculoskeletal: Normal range of motion. He exhibits no edema and no tenderness.  Neurological: He is alert and oriented to person, place, and time.  CN 3-12 intact  Skin: Skin is warm and dry.  Psychiatric: He has a normal mood and affect. His behavior is normal. Judgment and thought content normal.    ED Course  Procedures (including critical care time) Results for orders placed during the hospital encounter of 04/23/13  PHENYTOIN LEVEL, TOTAL      Result Value  Range   Phenytoin Lvl <2.5 (*) 10.0 - 20.0 ug/mL  POCT I-STAT, CHEM 8      Result Value Range   Sodium 142  135 - 145 mEq/L   Potassium 3.6  3.5 - 5.1 mEq/L   Chloride 104  96 - 112 mEq/L   BUN 16  6 - 23 mg/dL   Creatinine, Ser 9.60  0.50 - 1.35 mg/dL   Glucose, Bld 454 (*) 70 - 99 mg/dL   Calcium, Ion 0.98  1.19 - 1.23 mmol/L   TCO2 24  0 - 100 mmol/L   Hemoglobin 15.0  13.0 - 17.0 g/dL   HCT 14.7  82.9 - 56.2 %   No results found.   EKG Interpretation    Date/Time:  Saturday April 23 2013 19:30:46 EST Ventricular Rate:  73 PR Interval:  163 QRS Duration: 114 QT Interval:  380 QTC Calculation: 419 R Axis:   83 Text Interpretation:  Sinus rhythm Borderline intraventricular conduction delay ST elev, probable normal early repol pattern No significant change since last tracing Confirmed by Denton Lank  MD, KEVIN ([id number]) on 04/23/2013 7:40:28 PM            MDM   1. Seizure     Patient with seizure disorder with a seizure today. He has been out of his medications for the past 2 weeks. Will check a Dilantin level, electrolytes, and EKG. Anticipate discharge.  Suspect that the patient's seizure was 2/2 medical noncompliance.  Patient is stable and alert.  Not in any apparent distress.  Discharge to home with neurology follow-up. Seen by and discussed with Dr. Denton Lank, who agrees with the plan.   Roxy Horseman, PA-C 04/23/13 2104

## 2013-04-25 NOTE — ED Provider Notes (Signed)
Medical screening examination/treatment/procedure(s) were conducted as a shared visit with non-physician practitioner(s) and myself.  I personally evaluated the patient during the encounter.  EKG Interpretation    Date/Time:  Saturday April 23 2013 19:30:46 EST Ventricular Rate:  73 PR Interval:  163 QRS Duration: 114 QT Interval:  380 QTC Calculation: 419 R Axis:   83 Text Interpretation:  Sinus rhythm Borderline intraventricular conduction delay ST elev, probable normal early repol pattern No significant change since last tracing Confirmed by Lucian Baswell  MD, Jaziyah Gradel ([id number]) on 04/23/2013 7:40:28 PM            Pt states hx sz, sz earlier today, no injury. States feels fine now, at baseline. No fever. No headache or neck pain. Recent noncompliance w meds. Labs. Rx.     Suzi Roots, MD 04/25/13 [phone]

## 2014-05-30 ENCOUNTER — Emergency Department (HOSPITAL_COMMUNITY): Payer: Self-pay

## 2014-05-30 ENCOUNTER — Emergency Department (HOSPITAL_COMMUNITY)
Admission: EM | Admit: 2014-05-30 | Discharge: 2014-05-30 | Disposition: A | Discharge status: Home / Self Care | Payer: Self-pay | Attending: Emergency Medicine | Admitting: Emergency Medicine

## 2014-05-30 ENCOUNTER — Encounter (HOSPITAL_COMMUNITY): Payer: Self-pay | Admitting: Emergency Medicine

## 2014-05-30 DIAGNOSIS — R05 Cough: Secondary | ICD-10-CM

## 2014-05-30 DIAGNOSIS — G40909 Epilepsy, unspecified, not intractable, without status epilepticus: Secondary | ICD-10-CM | POA: Insufficient documentation

## 2014-05-30 DIAGNOSIS — K0889 Other specified disorders of teeth and supporting structures: Secondary | ICD-10-CM

## 2014-05-30 DIAGNOSIS — Z79899 Other long term (current) drug therapy: Secondary | ICD-10-CM | POA: Insufficient documentation

## 2014-05-30 DIAGNOSIS — K088 Other specified disorders of teeth and supporting structures: Secondary | ICD-10-CM | POA: Insufficient documentation

## 2014-05-30 DIAGNOSIS — J069 Acute upper respiratory infection, unspecified: Secondary | ICD-10-CM | POA: Insufficient documentation

## 2014-05-30 DIAGNOSIS — R059 Cough, unspecified: Secondary | ICD-10-CM

## 2014-05-30 DIAGNOSIS — J45901 Unspecified asthma with (acute) exacerbation: Secondary | ICD-10-CM | POA: Insufficient documentation

## 2014-05-30 DIAGNOSIS — Z720 Tobacco use: Secondary | ICD-10-CM | POA: Insufficient documentation

## 2014-05-30 DIAGNOSIS — K029 Dental caries, unspecified: Secondary | ICD-10-CM | POA: Insufficient documentation

## 2014-05-30 MED ORDER — PENICILLIN V POTASSIUM 500 MG PO TABS
500.0000 mg | ORAL_TABLET | Freq: Once | ORAL | Status: AC
  Administered 2014-05-30: 500 mg via ORAL
  Filled 2014-05-30: qty 1

## 2014-05-30 MED ORDER — TRAMADOL HCL 50 MG PO TABS
50.0000 mg | ORAL_TABLET | Freq: Once | ORAL | Status: AC
  Administered 2014-05-30: 50 mg via ORAL
  Filled 2014-05-30: qty 1

## 2014-05-30 MED ORDER — IPRATROPIUM BROMIDE 0.02 % IN SOLN
0.5000 mg | Freq: Once | RESPIRATORY_TRACT | Status: AC
  Administered 2014-05-30: 0.5 mg via RESPIRATORY_TRACT
  Filled 2014-05-30: qty 2.5

## 2014-05-30 MED ORDER — PENICILLIN V POTASSIUM 500 MG PO TABS
500.0000 mg | ORAL_TABLET | Freq: Four times a day (QID) | ORAL | Status: AC
Start: 2014-05-30 — End: 2014-06-06

## 2014-05-30 MED ORDER — ALBUTEROL SULFATE HFA 108 (90 BASE) MCG/ACT IN AERS
2.0000 | INHALATION_SPRAY | RESPIRATORY_TRACT | Status: DC | PRN
  Administered 2014-05-30: 2 via RESPIRATORY_TRACT
  Filled 2014-05-30: qty 6.7

## 2014-05-30 MED ORDER — ALBUTEROL SULFATE (2.5 MG/3ML) 0.083% IN NEBU
5.0000 mg | INHALATION_SOLUTION | Freq: Once | RESPIRATORY_TRACT | Status: AC
  Administered 2014-05-30: 5 mg via RESPIRATORY_TRACT
  Filled 2014-05-30: qty 6

## 2014-05-30 MED ORDER — TRAMADOL HCL 50 MG PO TABS: 50.0000 mg | ORAL_TABLET | Freq: Four times a day (QID) | ORAL | Status: DC | PRN

## 2014-05-30 MED ORDER — IBUPROFEN 200 MG PO TABS
600.0000 mg | ORAL_TABLET | Freq: Once | ORAL | Status: AC
  Administered 2014-05-30: 600 mg via ORAL
  Filled 2014-05-30: qty 3

## 2014-05-30 MED ORDER — PREDNISONE 20 MG PO TABS: 60.0000 mg | ORAL_TABLET | Freq: Every day | ORAL | Status: DC

## 2014-05-30 MED ORDER — PREDNISONE 20 MG PO TABS
60.0000 mg | ORAL_TABLET | Freq: Once | ORAL | Status: AC
  Administered 2014-05-30: 60 mg via ORAL
  Filled 2014-05-30: qty 3

## 2014-05-30 NOTE — ED Notes (Signed)
Pt reports acute l/lower back dental pain x 2 days. Also c/o shortness of breath and sinus congestion x 1 week. Pt ran out of inhaler medication

## 2014-05-30 NOTE — ED Provider Notes (Signed)
CSN: [id number]     Arrival date & time 05/30/14  1046 History   First MD Initiated Contact with Patient 05/30/14 1056     Chief Complaint  Patient presents with  . Shortness of Breath    feels short of breath x 1 week  . Dental Pain    pain in l/low back of mouth x 2 days     (Consider location/radiation/quality/duration/timing/severity/associated sxs/prior Treatment) HPI Comments: Patient presents today with two separate complaints.  He is complaining of dental pain and also a productive cough.  He reports that he has had left lower dental pain intermittently for the past 2 months, which has worsened over the past couple of days.  He denies any dental injury or trauma, but states that the tooth "is rotting through."  He has been taking Ibuprofen for the pain, but does not feel that it helps.  He denies any fever, chills, nausea, vomiting, or difficulty swallowing.  He does not have a dentist.    He is also complaining of a productive cough and nasal congestion over the past week, which is gradually worsening.  He has a history of Asthma.  He has been using his Albuterol inhaler, but ran out earlier today.  He denies fever, chills, SOB, or chest pain.    Patient is a 28 y.o. male presenting with shortness of breath and tooth pain. The history is provided by the patient.  Shortness of Breath Dental Pain   Past Medical History  Diagnosis Date  . Asthma   . Seizure    History reviewed. No pertinent past surgical history. No family history on file. History  Substance Use Topics  . Smoking status: Current Every Day Smoker -- 1.00 packs/day    Types: Cigarettes  . Smokeless tobacco: Not on file  . Alcohol Use: Yes     Comment: occasional    Review of Systems  Respiratory: Positive for shortness of breath.   All other systems reviewed and are negative.     Allergies  Review of patient's allergies indicates no known allergies.  Home Medications   Prior to Admission  medications   Medication Sig Start Date End Date Taking? Authorizing Provider  albuterol (PROVENTIL HFA;VENTOLIN HFA) 108 (90 BASE) MCG/ACT inhaler Inhale 1-2 puffs into the lungs every 6 (six) hours as needed for wheezing or shortness of breath.     Historical Provider, MD  phenytoin (DILANTIN) 100 MG ER capsule Take 3 capsules (300 mg total) by mouth daily. 04/23/13   Roxy Horsemanobert Browning, PA-C   BP 140/94 mmHg  Pulse 85  Temp(Src) 98.1 F (36.7 C) (Oral)  Resp 20  SpO2 96% Physical Exam  Constitutional: He appears well-developed and well-nourished.  HENT:  Head: Normocephalic and atraumatic.  Right Ear: Tympanic membrane and ear canal normal.  Left Ear: Tympanic membrane and ear canal normal.  Mouth/Throat: Uvula is midline and oropharynx is clear and moist. No trismus in the jaw. Dental caries present. No oropharyngeal exudate, posterior oropharyngeal edema or posterior oropharyngeal erythema.  Dental decay of the left lower molar tooth.  Tenderness to palpation of the left lower gingiva.  No drainage.  No dental abscess visualized.  No trismus.  No sublingual tenderness or swelling.  No submandibular or submental lymphadenopathy.  Neck: Normal range of motion. Neck supple.  Cardiovascular: Normal rate, regular rhythm and normal heart sounds.   Pulmonary/Chest: Effort normal and breath sounds normal.  Mild diffuse inspiratory and expiratory wheezing  Lymphadenopathy:    He has  no cervical adenopathy.  Neurological: He is alert.  Skin: Skin is warm and dry.  Psychiatric: He has a normal mood and affect.  Nursing note and vitals reviewed.   ED Course  Procedures (including critical care time) Labs Review Labs Reviewed - No data to display  Imaging Review Dg Chest 2 View  05/30/2014   CLINICAL DATA:  Cough, congestion, shortness of breath for 2-3 days. History of asthma. Smoker.  EXAM: CHEST  2 VIEW  COMPARISON:  02/06/2013  FINDINGS: The heart size and mediastinal contours are  within normal limits. Both lungs are clear. The visualized skeletal structures are unremarkable.  IMPRESSION: No active cardiopulmonary disease.   Electronically Signed   By: Charlett NoseKevin  Dover M.D.   On: 05/30/2014 11:27     EKG Interpretation None     12:00 PM Lungs CTAB.   Patient reports symptoms have improved. MDM   Final diagnoses:  Cough   Patient presents with two separate complaints.  He is complaining of a cough and also dental pain.  Patient with toothache.  No gross abscess.  Exam unconcerning for Ludwig's angina or spread of infection.  Will treat with penicillin and pain medicine.  Urged patient to follow-up with dentist.  Pt CXR negative for acute infiltrate. Patients symptoms are consistent with URI, likely viral etiology. Discussed that antibiotics are not indicated for viral infections. Pt will be discharged with symptomatic treatment.  Verbalizes understanding and is agreeable with plan. Pt is hemodynamically stable & in NAD prior to dc.  Patient stable for discharge.  Return precautions given.          Santiago GladHeather Zachary Nole, PA-C 05/31/14 2241  Merrie RoofJohn David Wofford III, MD 06/01/14 [phone]

## 2014-05-30 NOTE — Discharge Instructions (Signed)
You have a dental injury. Use the resource guide listed below to help you find a dentist if you do not already have one to followup with. It is very important that you get evaluated by a dentist as soon as possible. Call tomorrow to schedule an appointment. Use your pain medication as prescribed and do not operate heavy machinery while on pain medication. ake your full course of antibiotics. Read the instructions below.  Eat a soft or liquid diet and rinse your mouth out after meals with warm water. You should see a dentist or return here at once if you have increased swelling, increased pain or uncontrolled bleeding from the site of your injury.   SEEK MEDICAL CARE IF:   You have increased pain not controlled with medicines.   You have swelling around your tooth, in your face or neck.   You have bleeding which starts, continues, or gets worse.   You have a fever >101  If you are unable to open your mouth  RESOURCE GUIDE  Dental Problems  Patients with Medicaid: [facility]                     [facility] [phone]                                           [phone] Phone:  [phone]                                                  Phone:  [phone]  If unable to pay or uninsured, contact:  [facility] or [facility] to become qualified for the adult dental clinic.  Chronic Pain Problems Contact [facility]  [phone] Patients need to be referred by their primary care doctor.  Insufficient Money for Medicine Contact United Way:  call "211" or [facility] [phone].  No Primary Care Doctor Call [facility]  [phone] Other agencies that provide inexpensive medical care    [facility]  [phone]    [facility]  [phone]    [facility]  [phone]    [facility]  [phone]    [facility]  [phone]    [facility]   [phone]  Psychological Services [facility]  [phone] [facility]  [phone] [facility]   [phone] (emergency services [phone])  Substance Abuse Resources [facility]  [phone] [facility] [phone] [facility] [phone] [facility] [phone] Residential & Outpatient Substance Abuse Program  [phone]  Abuse/Neglect [facility] Child [facility] Hotline [phone](336) [phone] [facility] [phone] (After Hours)  Emergency [facility]ter [facility] [phone]  [facility] [phone] [facility] [phone]  MRSA Hotline #:   872-353-9783(704)[phone]    [facility]  [facility]     [employer]                          [facility] [address] [place]  [address]      [address]  °[place]                                                [place]                            [place] °Phone:  [phone]                                   Phone:  [phone]                 Phone:  [phone] ° °[facility] °Phone:  342-8316 ° °[place] Child Abuse Hotline °(336) [phone] °([phone]) [phone] (After Hours) ° ° ° ° °

## 2014-06-30 ENCOUNTER — Encounter (HOSPITAL_COMMUNITY): Payer: Self-pay | Admitting: Emergency Medicine

## 2014-06-30 ENCOUNTER — Emergency Department (HOSPITAL_COMMUNITY)
Admission: EM | Admit: 2014-06-30 | Discharge: 2014-06-30 | Disposition: A | Discharge status: Home / Self Care | Payer: Self-pay | Attending: Emergency Medicine | Admitting: Emergency Medicine

## 2014-06-30 DIAGNOSIS — Z79899 Other long term (current) drug therapy: Secondary | ICD-10-CM | POA: Insufficient documentation

## 2014-06-30 DIAGNOSIS — K088 Other specified disorders of teeth and supporting structures: Secondary | ICD-10-CM | POA: Insufficient documentation

## 2014-06-30 DIAGNOSIS — G40909 Epilepsy, unspecified, not intractable, without status epilepticus: Secondary | ICD-10-CM | POA: Insufficient documentation

## 2014-06-30 DIAGNOSIS — Z7952 Long term (current) use of systemic steroids: Secondary | ICD-10-CM | POA: Insufficient documentation

## 2014-06-30 DIAGNOSIS — J45909 Unspecified asthma, uncomplicated: Secondary | ICD-10-CM | POA: Insufficient documentation

## 2014-06-30 DIAGNOSIS — K0889 Other specified disorders of teeth and supporting structures: Secondary | ICD-10-CM

## 2014-06-30 DIAGNOSIS — K006 Disturbances in tooth eruption: Secondary | ICD-10-CM | POA: Insufficient documentation

## 2014-06-30 DIAGNOSIS — Z720 Tobacco use: Secondary | ICD-10-CM | POA: Insufficient documentation

## 2014-06-30 MED ORDER — LIDOCAINE HCL 2 % IJ SOLN: 15.0000 mL | Freq: Once | INTRAMUSCULAR | Status: DC

## 2014-06-30 MED ORDER — AMOXICILLIN 500 MG PO CAPS
500.0000 mg | ORAL_CAPSULE | Freq: Once | ORAL | Status: AC
  Administered 2014-06-30: 500 mg via ORAL
  Filled 2014-06-30: qty 1

## 2014-06-30 MED ORDER — HYDROCODONE-ACETAMINOPHEN 5-325 MG PO TABS: ORAL_TABLET | ORAL | Status: DC

## 2014-06-30 MED ORDER — HYDROCODONE-ACETAMINOPHEN 5-325 MG PO TABS
1.0000 | ORAL_TABLET | Freq: Once | ORAL | Status: AC
Start: 2014-06-30 — End: 2014-06-30
  Administered 2014-06-30: 1 via ORAL
  Filled 2014-06-30: qty 1

## 2014-06-30 MED ORDER — AMOXICILLIN 500 MG PO CAPS: 500.0000 mg | ORAL_CAPSULE | Freq: Three times a day (TID) | ORAL | Status: DC

## 2014-06-30 NOTE — Discharge Instructions (Signed)
Take vicodin for breakthrough pain, do not drink alcohol, drive, care for children or do other critical tasks while taking vicodin. ° °Return to the emergency room for fever, change in vision, redness to the face that rapidly spreads towards the eye, nausea or vomiting, difficulty swallowing or shortness of breath. °  °Apply warm compresses to jaw throughout the day.  ° °Take your antibiotics as directed and to the end of the course.  ° °Followup with a dentist is very important for ongoing evaluation and management of recurrent dental pain. Return to emergency department for emergent changing or worsening symptoms." ° °Low-cost dental clinic: °**David  Civils  at [phone]**  °**Janna Civils at [phone] [address]**   ° °You may also call [phone] ° °Dental Assistance °If the dentist on-call cannot see you, please use the resources below: ° ° °Patients with Medicaid: [facility] °[address], [phone] °[address], [phone] ° °If unable to pay, or uninsured, contact [facility] ([phone]) or [facility] ([phone] in [place], [phone] in [place]) to become qualified for the adult dental clinic ° °Other Low-Cost Community Dental Services: °[facility]- [address], [place], [place], [address] °   723-1848, Ext. 123 °   2nd and 4th Thursday of the month at 6:30am °   10 clients each day by appointment, can sometimes see walk-in     patients if someone does not show for an appointment °[facility]- [address], [place], [place], [address] °   [phone] °[facility]- [address], [place], [place], [address] °   631-2330 ° °[facility]- [phone] °[facility]- [phone] °[facility]- [phone] ° °

## 2014-06-30 NOTE — ED Notes (Signed)
Pt states he has a toothache on the bottom left

## 2014-06-30 NOTE — ED Provider Notes (Signed)
CSN: [id number]     Arrival date & time 06/30/14  [id number] History   First MD Initiated Contact with Patient 06/30/14 [phone]     Chief Complaint  Patient presents with  . Dental Pain     (Consider location/radiation/quality/duration/timing/severity/associated sxs/prior Treatment) Patient is a 29 y.o. male presenting with tooth pain.  Dental Pain   Suzan GaribaldiBrandon D Clemenson is a 29 y.o. male complaining of severe 8/10 left lower dental pain onset 3 days ago, recurrent. Patient's been taking ibuprofen at home with no relief. Denies fever/chills, difficulty opening jaw, difficulty swallowing, SOB, gum swelling, facial swelling, neck swelling.    Past Medical History  Diagnosis Date  . Asthma   . Seizure    History reviewed. No pertinent past surgical history. Family History  Problem Relation Age of Onset  . Cancer Other    History  Substance Use Topics  . Smoking status: Current Every Day Smoker -- 1.00 packs/day    Types: Cigarettes  . Smokeless tobacco: Not on file  . Alcohol Use: Yes     Comment: occasional    Review of Systems  10 systems reviewed and found to be negative, except as noted in the HPI.    Allergies  Review of patient's allergies indicates no known allergies.  Home Medications   Prior to Admission medications   Medication Sig Start Date End Date Taking? Authorizing Provider  albuterol (PROVENTIL HFA;VENTOLIN HFA) 108 (90 BASE) MCG/ACT inhaler Inhale 1-2 puffs into the lungs every 6 (six) hours as needed for wheezing or shortness of breath.     Historical Provider, MD  phenytoin (DILANTIN) 100 MG ER capsule Take 3 capsules (300 mg total) by mouth daily. 04/23/13   Roxy Horsemanobert Browning, PA-C  predniSONE (DELTASONE) 20 MG tablet Take 3 tablets (60 mg total) by mouth daily. 05/30/14   Heather Laisure, PA-C  traMADol (ULTRAM) 50 MG tablet Take 1 tablet (50 mg total) by mouth every 6 (six) hours as needed. 05/30/14   Heather Laisure, PA-C   BP 150/84 mmHg  Pulse 94   Temp(Src) 98.1 F (36.7 C) (Oral)  Resp 18  SpO2 98% Physical Exam  Constitutional: He is oriented to person, place, and time. He appears well-developed and well-nourished. No distress.  HENT:  Head: Normocephalic.  Mouth/Throat: Oropharynx is clear and moist.    Generally poor dentition, no gingival swelling, erythema or tenderness to palpation. Patient is handling their secretions. There is no tenderness to palpation or firmness underneath tongue bilaterally. No trismus.    Eyes: Conjunctivae and EOM are normal. Pupils are equal, round, and reactive to light.  Neck: Normal range of motion.  Cardiovascular: Normal rate.   Pulmonary/Chest: Effort normal. No stridor.  Abdominal: Soft.  Musculoskeletal: Normal range of motion.  Lymphadenopathy:    He has no cervical adenopathy.  Neurological: He is alert and oriented to person, place, and time.  Psychiatric: He has a normal mood and affect.  Nursing note and vitals reviewed.   ED Course  Procedures (including critical care time) Labs Review Labs Reviewed - No data to display  Imaging Review No results found.   EKG Interpretation None      MDM   Final diagnoses:  None    Filed Vitals:   06/30/14 0705 06/30/14 0733  BP: 150/84   Pulse: 94   Temp: 98.1 F (36.7 C)   TempSrc: Oral   Resp: 18   Height:  5\' 9"  (1.753 m)  Weight:  135 lb (61.236 kg)  SpO2:  98%     Medications  lidocaine (XYLOCAINE) 2 % (with pres) injection 300 mg (not administered)  HYDROcodone-acetaminophen (NORCO/VICODIN) 5-325 MG per tablet 1 tablet (1 tablet Oral Given 06/30/14 0740)  amoxicillin (AMOXIL) capsule 500 mg (500 mg Oral Given 06/30/14 0740)    Suzan Garibaldi is a pleasant 29 y.o. male presenting with dental pain associated with dental caries but no signs or symptoms of dental abscess. Patient afebrile, non toxic appearing and swallowing secretions well. I gave patient referral to dentist and stressed the importance of dental  follow up for definitive management of dental issues. Patient voices understanding and is agreeable to plan.   Evaluation does not show pathology that would require ongoing emergent intervention or inpatient treatment. Pt is hemodynamically stable and mentating appropriately. Discussed findings and plan with patient/guardian, who agrees with care plan. All questions answered. Return precautions discussed and outpatient follow up given.   Discharge Medication List as of 06/30/2014  7:37 AM    START taking these medications   Details  amoxicillin (AMOXIL) 500 MG capsule Take 1 capsule (500 mg total) by mouth 3 (three) times daily., Starting 06/30/2014, Until Discontinued, Print    HYDROcodone-acetaminophen (NORCO/VICODIN) 5-325 MG per tablet Take 1-2 tablets by mouth every 6 hours as needed for pain and/or cough., Print             [place], PA-C 06/30/14 4098  Elwin Mocha, MD 06/30/14 [phone]

## 2014-08-05 ENCOUNTER — Emergency Department (HOSPITAL_COMMUNITY): Payer: Self-pay

## 2014-08-05 ENCOUNTER — Encounter (HOSPITAL_COMMUNITY): Payer: Self-pay | Admitting: Emergency Medicine

## 2014-08-05 ENCOUNTER — Emergency Department (HOSPITAL_COMMUNITY)
Admission: EM | Admit: 2014-08-05 | Discharge: 2014-08-06 | Disposition: A | Discharge status: Home / Self Care | Payer: Self-pay | Attending: Emergency Medicine | Admitting: Emergency Medicine

## 2014-08-05 DIAGNOSIS — Z720 Tobacco use: Secondary | ICD-10-CM | POA: Insufficient documentation

## 2014-08-05 DIAGNOSIS — G40909 Epilepsy, unspecified, not intractable, without status epilepticus: Secondary | ICD-10-CM | POA: Insufficient documentation

## 2014-08-05 DIAGNOSIS — J4520 Mild intermittent asthma, uncomplicated: Secondary | ICD-10-CM

## 2014-08-05 DIAGNOSIS — H53142 Visual discomfort, left eye: Secondary | ICD-10-CM | POA: Insufficient documentation

## 2014-08-05 DIAGNOSIS — H5712 Ocular pain, left eye: Secondary | ICD-10-CM | POA: Insufficient documentation

## 2014-08-05 DIAGNOSIS — Z79899 Other long term (current) drug therapy: Secondary | ICD-10-CM | POA: Insufficient documentation

## 2014-08-05 DIAGNOSIS — Z792 Long term (current) use of antibiotics: Secondary | ICD-10-CM | POA: Insufficient documentation

## 2014-08-05 DIAGNOSIS — J4521 Mild intermittent asthma with (acute) exacerbation: Secondary | ICD-10-CM | POA: Insufficient documentation

## 2014-08-05 DIAGNOSIS — Z7952 Long term (current) use of systemic steroids: Secondary | ICD-10-CM | POA: Insufficient documentation

## 2014-08-05 MED ORDER — FLUORESCEIN SODIUM 1 MG OP STRP
1.0000 | ORAL_STRIP | Freq: Once | OPHTHALMIC | Status: DC
  Filled 2014-08-05: qty 1

## 2014-08-05 MED ORDER — TETRACAINE HCL 0.5 % OP SOLN
2.0000 [drp] | Freq: Once | OPHTHALMIC | Status: DC
  Filled 2014-08-05: qty 2

## 2014-08-05 MED ORDER — ALBUTEROL SULFATE HFA 108 (90 BASE) MCG/ACT IN AERS
2.0000 | INHALATION_SPRAY | Freq: Once | RESPIRATORY_TRACT | Status: AC
  Administered 2014-08-05: 2 via RESPIRATORY_TRACT
  Filled 2014-08-05: qty 6.7

## 2014-08-05 MED ORDER — ALBUTEROL SULFATE HFA 108 (90 BASE) MCG/ACT IN AERS: 1.0000 | INHALATION_SPRAY | Freq: Four times a day (QID) | RESPIRATORY_TRACT | Status: DC | PRN

## 2014-08-05 MED ORDER — TOBRAMYCIN 0.3 % OP SOLN
2.0000 [drp] | Freq: Four times a day (QID) | OPHTHALMIC | Status: DC
  Administered 2014-08-05: 2 [drp] via OPHTHALMIC
  Filled 2014-08-05: qty 5

## 2014-08-05 NOTE — Discharge Instructions (Signed)

## 2014-08-05 NOTE — ED Provider Notes (Signed)
CSN: [id number]     Arrival date & time 08/05/14  1946 History   None    This chart was scribed for non-physician practitioner, Elpidio AnisShari Aliviah Spain PA-C, working with Flint MelterElliott L Wentz, MD by Arlan OrganAshley Leger, ED Scribe. This patient was seen in room WTR8/WTR8 and the patient's care was started at 10:31 PM.   Chief Complaint  Patient presents with  . Eye Pain    left  . Asthma   The history is provided by the patient. No language interpreter was used.   HPI Comments: Bruce Galvan is a 29 y.o. male with a PMHx of asthma who presents to the Emergency Department complaining of an asthma excerebration onset 4 PM this afternoon after waking up from sleep. He reports ongoing SOB, wheezing, and stinging with breathing. Pt states episode lasted for approximately 3 hours and has now resolved. However, wheezing is still persistent. Mr. Alto DenverHunt states he recently ran out of his rescue inhaler 3 days ago. Last asthma attack 1 month ago. No lightheadedness, SOB, CP, HA. Pt is an every day smoker and smokes approximately 1 pack of cigarettes a day.  Pt reports constant, moderate L eye pain with associated photophobia onset this morning after waking from sleep. No recent trauma to eye. Pain is described as stinging and rated 7/10. He has tried OTC Ibuprofen without any improvement for discomfort. Pt denies any foreign bodies in eye. No blurred vision. No recent sick contacts. No eye contact of glasses use.  Past Medical History  Diagnosis Date  . Asthma   . Seizure    History reviewed. No pertinent past surgical history. Family History  Problem Relation Age of Onset  . Cancer Other    History  Substance Use Topics  . Smoking status: Current Every Day Smoker -- 0.75 packs/day    Types: Cigarettes  . Smokeless tobacco: Not on file  . Alcohol Use: No     Comment: denies 08/05/2014    Review of Systems  Eyes: Positive for photophobia and pain. Negative for discharge, redness, itching and visual disturbance.   Respiratory: Positive for shortness of breath and wheezing.   Cardiovascular: Negative for chest pain.      Allergies  Review of patient's allergies indicates no known allergies.  Home Medications   Prior to Admission medications   Medication Sig Start Date End Date Taking? Authorizing Provider  albuterol (PROVENTIL HFA;VENTOLIN HFA) 108 (90 BASE) MCG/ACT inhaler Inhale 2 puffs into the lungs every 6 (six) hours as needed for wheezing or shortness of breath.    Yes Historical Provider, MD  amoxicillin (AMOXIL) 500 MG capsule Take 1 capsule (500 mg total) by mouth 3 (three) times daily. Patient not taking: Reported on 08/05/2014 06/30/14   Joni ReiningNicole Pisciotta, PA-C  HYDROcodone-acetaminophen (NORCO/VICODIN) 5-325 MG per tablet Take 1-2 tablets by mouth every 6 hours as needed for pain and/or cough. Patient not taking: Reported on 08/05/2014 06/30/14   Joni ReiningNicole Pisciotta, PA-C  ibuprofen (ADVIL,MOTRIN) 200 MG tablet Take 600-800 mg by mouth every 6 (six) hours as needed (For tooth pain.).    Historical Provider, MD  phenytoin (DILANTIN) 100 MG ER capsule Take 3 capsules (300 mg total) by mouth daily. Patient not taking: Reported on 06/30/2014 04/23/13   Roxy Horsemanobert Browning, PA-C  predniSONE (DELTASONE) 20 MG tablet Take 3 tablets (60 mg total) by mouth daily. Patient not taking: Reported on 06/30/2014 05/30/14   Santiago GladHeather Laisure, PA-C  traMADol (ULTRAM) 50 MG tablet Take 1 tablet (50 mg total) by mouth  every 6 (six) hours as needed. Patient not taking: Reported on 06/30/2014 05/30/14   Santiago Glad, PA-C   Triage Vitals: BP 136/90 mmHg  Pulse 82  Temp(Src) 98.3 F (36.8 C) (Oral)  Resp 18  Wt 126 lb (57.153 kg)  SpO2 97%   Physical Exam  Constitutional: He is oriented to person, place, and time. He appears well-developed and well-nourished.  HENT:  Head: Normocephalic.  Eyes: EOM are normal. Pupils are equal, round, and reactive to light. Right eye exhibits no discharge. Left eye exhibits no  discharge.  Slight redness along medial left sclera. No subconjunctival hemorrhage. Cornea clear. Fluorescein stain uptake medial sclera. No corneal abrasion.  Neck: Normal range of motion. Neck supple.  Pulmonary/Chest: Effort normal. He has wheezes. He has rales. He exhibits no tenderness.  Abdominal: He exhibits no distension.  Musculoskeletal: Normal range of motion. He exhibits no edema.  Neurological: He is alert and oriented to person, place, and time.  Skin: Skin is warm and dry.  Psychiatric: He has a normal mood and affect.  Nursing note and vitals reviewed.   ED Course  Procedures (including critical care time)  DIAGNOSTIC STUDIES: Oxygen Saturation is 97% on RA, adequate by my interpretation.    COORDINATION OF CARE: 10:38 PM-Discussed treatment plan with pt at bedside and pt agreed to plan.     Labs Review Labs Reviewed - No data to display  Imaging Review Dg Chest 2 View  08/05/2014   CLINICAL DATA:  Shortness of breath and cough. History of asthma and smoking. Left eye pain  EXAM: CHEST  2 VIEW  COMPARISON:  05/30/2014  FINDINGS: Normal heart size and mediastinal contours. No acute infiltrate or edema. No effusion or pneumothorax. No acute osseous findings.  IMPRESSION: Negative chest.   Electronically Signed   By: Marnee Spring M.D.   On: 08/05/2014 22:10     EKG Interpretation None      MDM   Final diagnoses:  None    1. Asthma 2. Left eye irritation  He feels improved with 2 puffs of inhaler. No nebulizer treatment needed. Eye pain will be treated with topical abx and ophtho follow up prn.   I personally performed the services described in this documentation, which was scribed in my presence. The recorded information has been reviewed and is accurate.     Arnoldo Hooker, PA-C 08/05/14 2343  Mancel Bale, MD 08/07/14 1128

## 2014-08-05 NOTE — ED Notes (Signed)
Patient arrives with initial complaint of asthma, states symptoms flared up around 1700, states he does not have an inhaler. Patient also c/o left eye pain, denies injury.

## 2014-09-14 ENCOUNTER — Emergency Department (HOSPITAL_COMMUNITY)
Admission: EM | Admit: 2014-09-14 | Discharge: 2014-09-14 | Disposition: A | Discharge status: Home / Self Care | Payer: Self-pay | Attending: Emergency Medicine | Admitting: Emergency Medicine

## 2014-09-14 ENCOUNTER — Encounter (HOSPITAL_COMMUNITY): Payer: Self-pay | Admitting: *Deleted

## 2014-09-14 ENCOUNTER — Emergency Department (HOSPITAL_COMMUNITY): Payer: Self-pay

## 2014-09-14 DIAGNOSIS — R569 Unspecified convulsions: Secondary | ICD-10-CM

## 2014-09-14 DIAGNOSIS — J45909 Unspecified asthma, uncomplicated: Secondary | ICD-10-CM | POA: Insufficient documentation

## 2014-09-14 DIAGNOSIS — Z9114 Patient's other noncompliance with medication regimen: Secondary | ICD-10-CM

## 2014-09-14 DIAGNOSIS — Y9389 Activity, other specified: Secondary | ICD-10-CM | POA: Insufficient documentation

## 2014-09-14 DIAGNOSIS — Y998 Other external cause status: Secondary | ICD-10-CM | POA: Insufficient documentation

## 2014-09-14 DIAGNOSIS — Z720 Tobacco use: Secondary | ICD-10-CM | POA: Insufficient documentation

## 2014-09-14 DIAGNOSIS — Y9289 Other specified places as the place of occurrence of the external cause: Secondary | ICD-10-CM | POA: Insufficient documentation

## 2014-09-14 DIAGNOSIS — S0083XA Contusion of other part of head, initial encounter: Secondary | ICD-10-CM | POA: Insufficient documentation

## 2014-09-14 DIAGNOSIS — X58XXXA Exposure to other specified factors, initial encounter: Secondary | ICD-10-CM | POA: Insufficient documentation

## 2014-09-14 DIAGNOSIS — Z79899 Other long term (current) drug therapy: Secondary | ICD-10-CM | POA: Insufficient documentation

## 2014-09-14 DIAGNOSIS — G40909 Epilepsy, unspecified, not intractable, without status epilepticus: Secondary | ICD-10-CM | POA: Insufficient documentation

## 2014-09-14 LAB — CBC WITH DIFFERENTIAL/PLATELET
Basophils Absolute: 0.1 10*3/uL (ref 0.0–0.1)
Basophils Relative: 1 % (ref 0–1)
EOS PCT: 1 % (ref 0–5)
Eosinophils Absolute: 0.1 10*3/uL (ref 0.0–0.7)
HEMATOCRIT: 47.7 % (ref 39.0–52.0)
Hemoglobin: 16.9 g/dL (ref 13.0–17.0)
Lymphocytes Relative: 15 % (ref 12–46)
Lymphs Abs: 2 10*3/uL (ref 0.7–4.0)
MCH: 32.6 pg (ref 26.0–34.0)
MCHC: 35.4 g/dL (ref 30.0–36.0)
MCV: 91.9 fL (ref 78.0–100.0)
Monocytes Absolute: 1.1 10*3/uL — ABNORMAL HIGH (ref 0.1–1.0)
Monocytes Relative: 8 % (ref 3–12)
Neutro Abs: 10.2 10*3/uL — ABNORMAL HIGH (ref 1.7–7.7)
Neutrophils Relative %: 75 % (ref 43–77)
Platelets: 202 10*3/uL (ref 150–400)
RBC: 5.19 MIL/uL (ref 4.22–5.81)
RDW: 13.2 % (ref 11.5–15.5)
WBC: 13.5 10*3/uL — AB (ref 4.0–10.5)

## 2014-09-14 LAB — COMPREHENSIVE METABOLIC PANEL
ALBUMIN: 4.6 g/dL (ref 3.5–5.2)
ALT: 18 U/L (ref 0–53)
ANION GAP: 7 (ref 5–15)
AST: 27 U/L (ref 0–37)
Alkaline Phosphatase: 83 U/L (ref 39–117)
BILIRUBIN TOTAL: 0.8 mg/dL (ref 0.3–1.2)
BUN: 12 mg/dL (ref 6–23)
CALCIUM: 9.1 mg/dL (ref 8.4–10.5)
CO2: 23 mmol/L (ref 19–32)
Chloride: 108 mmol/L (ref 96–112)
Creatinine, Ser: 1.01 mg/dL (ref 0.50–1.35)
GFR calc Af Amer: >90 mL/min (ref >90)
Glucose, Bld: 109 mg/dL — ABNORMAL HIGH (ref 70–99)
Potassium: 4.3 mmol/L (ref 3.5–5.1)
SODIUM: 138 mmol/L (ref 135–145)
Total Protein: 8.1 g/dL (ref 6.0–8.3)

## 2014-09-14 LAB — URINALYSIS, ROUTINE W REFLEX MICROSCOPIC
Bilirubin Urine: NEGATIVE
Glucose, UA: NEGATIVE mg/dL
KETONES UR: NEGATIVE mg/dL
LEUKOCYTES UA: NEGATIVE
NITRITE: NEGATIVE
Protein, ur: 30 mg/dL — AB
Specific Gravity, Urine: 1.021 (ref 1.005–1.030)
Urobilinogen, UA: 0.2 mg/dL (ref 0.0–1.0)
pH: 6 (ref 5.0–8.0)

## 2014-09-14 LAB — URINE MICROSCOPIC-ADD ON

## 2014-09-14 LAB — PHENYTOIN LEVEL, TOTAL: Phenytoin Lvl: <2.5 ug/mL — ABNORMAL LOW (ref 10.0–20.0)

## 2014-09-14 MED ORDER — PHENYTOIN SODIUM EXTENDED 100 MG PO CAPS: 300.0000 mg | ORAL_CAPSULE | Freq: Every day | ORAL | Status: DC

## 2014-09-14 MED ORDER — PHENYTOIN 50 MG PO CHEW
300.0000 mg | CHEWABLE_TABLET | Freq: Once | ORAL | Status: AC
  Administered 2014-09-14: 300 mg via ORAL
  Filled 2014-09-14: qty 6

## 2014-09-14 MED ORDER — SODIUM CHLORIDE 0.9 % IV BOLUS (SEPSIS): 1000.0000 mL | Freq: Once | INTRAVENOUS | Status: DC

## 2014-09-14 MED ORDER — ALBUTEROL SULFATE HFA 108 (90 BASE) MCG/ACT IN AERS: 1.0000 | INHALATION_SPRAY | Freq: Four times a day (QID) | RESPIRATORY_TRACT | Status: DC | PRN

## 2014-09-14 NOTE — ED Notes (Signed)
Pt stating he does not want the IV left in, requesting it be removed. RN aware. IV has been removed

## 2014-09-14 NOTE — ED Provider Notes (Signed)
CSN: [id number]     Arrival date & time 09/14/14  1522 History   First MD Initiated Contact with Patient 09/14/14 1532     Chief Complaint  Patient presents with  . Seizures     (Consider location/radiation/quality/duration/timing/severity/associated sxs/prior Treatment) HPI Bruce Galvan is a 29 year old male with past medical history of seizures, and noncompliance with medication who presents the ER after seizure. Patient's family member witnessed patient having a seizure at home, called 911. Family member reports seizure lasted approximately 1 minute, does not have description of the seizure. EMS reports the patient was initially postictal on their arrival, and became alert and oriented throughout transport. During my examination patient is fully alert, answering questions appropriately in full, clear sentences, alert and oriented 4. Patient reports typical having are a prior to seizures, experiences are today, felt this seizure was typical for him. Patient states he last had a seizure approximately 2 weeks ago. Patient states he has been out of his Dilantin for approximately one month. Patient reports mild headachefrom where he hit the back of his head during a seizure, and denies blurred vision, dizziness, lightheadedness, weakness, persistent are a, nausea, vomiting, fever, neck pain, chest pain, shortness of breath, abdominal pain, dysuria.  Past Medical History  Diagnosis Date  . Asthma   . Seizure    History reviewed. No pertinent past surgical history. Family History  Problem Relation Age of Onset  . Cancer Other    History  Substance Use Topics  . Smoking status: Current Every Day Smoker -- 0.75 packs/day    Types: Cigarettes  . Smokeless tobacco: Not on file  . Alcohol Use: No     Comment: denies 08/05/2014    Review of Systems  Constitutional: Negative for fever.  HENT: Negative for trouble swallowing.   Eyes: Negative for visual disturbance.  Respiratory: Negative for  shortness of breath.   Cardiovascular: Negative for chest pain.  Gastrointestinal: Negative for nausea, vomiting and abdominal pain.  Genitourinary: Negative for dysuria.  Musculoskeletal: Negative for neck pain.  Skin: Negative for rash.  Neurological: Positive for seizures and headaches. Negative for dizziness, weakness and numbness.  Psychiatric/Behavioral: Negative.       Allergies  Review of patient's allergies indicates no known allergies.  Home Medications   Prior to Admission medications   Medication Sig Start Date End Date Taking? Authorizing Provider  ibuprofen (ADVIL,MOTRIN) 200 MG tablet Take 400 mg by mouth every 6 (six) hours as needed for headache (headache).    Yes Historical Provider, MD  albuterol (PROVENTIL HFA;VENTOLIN HFA) 108 (90 BASE) MCG/ACT inhaler Inhale 1-2 puffs into the lungs every 6 (six) hours as needed for wheezing or shortness of breath. 09/14/14   Ladona Mow, PA-C  amoxicillin (AMOXIL) 500 MG capsule Take 1 capsule (500 mg total) by mouth 3 (three) times daily. Patient not taking: Reported on 08/05/2014 06/30/14   Joni Reining Pisciotta, PA-C  HYDROcodone-acetaminophen (NORCO/VICODIN) 5-325 MG per tablet Take 1-2 tablets by mouth every 6 hours as needed for pain and/or cough. Patient not taking: Reported on 08/05/2014 06/30/14   Joni Reining Pisciotta, PA-C  phenytoin (DILANTIN) 100 MG ER capsule Take 3 capsules (300 mg total) by mouth daily. At bedtime 09/14/14   Ladona Mow, PA-C  predniSONE (DELTASONE) 20 MG tablet Take 3 tablets (60 mg total) by mouth daily. Patient not taking: Reported on 06/30/2014 05/30/14   Santiago Glad, PA-C  traMADol (ULTRAM) 50 MG tablet Take 1 tablet (50 mg total) by mouth every 6 (six) hours as  needed. Patient not taking: Reported on 06/30/2014 05/30/14   Heather Laisure, PA-C   BP 123/81 mmHg  Pulse 71  Temp(Src) 97.9 F (36.6 C) (Oral)  Resp 17  SpO2 97% Physical Exam  Constitutional: He is oriented to person, place, and time. He  appears well-developed and well-nourished. No distress.  HENT:  Head: Normocephalic and atraumatic.    Right Ear: Tympanic membrane normal.  Left Ear: Tympanic membrane normal.  Nose: Nose normal.  Mouth/Throat: Uvula is midline and oropharynx is clear and moist. No uvula swelling. No oropharyngeal exudate, posterior oropharyngeal edema, posterior oropharyngeal erythema or tonsillar abscesses.  Small abrasion with associated mild hematoma noted to posterior superior aspect of parietal region of head no laceration. Hemorrhaging controlled.  Eyes: Conjunctivae and EOM are normal. Pupils are equal, round, and reactive to light. Right eye exhibits no discharge. Left eye exhibits no discharge. No scleral icterus.  Neck: Normal range of motion and full passive range of motion without pain. Neck supple. No spinous process tenderness and no muscular tenderness present. No rigidity. No edema, no erythema and normal range of motion present. No Brudzinski's sign and no Kernig's sign noted.  Cardiovascular: Normal rate, regular rhythm, normal heart sounds and normal pulses.   No murmur heard. Pulmonary/Chest: Effort normal and breath sounds normal. No accessory muscle usage. No tachypnea. No respiratory distress.  Abdominal: Soft. Normal appearance and bowel sounds are normal. There is no tenderness. There is no rigidity, no guarding, no tenderness at McBurney's point and negative Murphy's sign.  Musculoskeletal: Normal range of motion. He exhibits no edema or tenderness.  Neurological: He is alert and oriented to person, place, and time. He has normal strength. No cranial nerve deficit or sensory deficit. He displays a negative Romberg sign. Coordination and gait normal. GCS eye subscore is 4. GCS verbal subscore is 5. GCS motor subscore is 6.  Patient fully alert, answering questions appropriately in full, clear sentences. Cranial nerves II through XII grossly intact. Motor strength 5 out of 5 in all major  muscle groups of upper and lower extremities. Distal sensation intact.   Skin: Skin is warm and dry. No rash noted. He is not diaphoretic.  Psychiatric: He has a normal mood and affect.  Nursing note and vitals reviewed.   ED Course  Procedures (including critical care time) Labs Review Labs Reviewed  CBC WITH DIFFERENTIAL/PLATELET - Abnormal; Notable for the following:    WBC 13.5 (*)    Neutro Abs 10.2 (*)    Monocytes Absolute 1.1 (*)    All other components within normal limits  COMPREHENSIVE METABOLIC PANEL - Abnormal; Notable for the following:    Glucose, Bld 109 (*)    All other components within normal limits  PHENYTOIN LEVEL, TOTAL - Abnormal; Notable for the following:    Phenytoin Lvl <2.5 (*)    All other components within normal limits  URINALYSIS, ROUTINE W REFLEX MICROSCOPIC - Abnormal; Notable for the following:    Hgb urine dipstick SMALL (*)    Protein, ur 30 (*)    All other components within normal limits  URINE MICROSCOPIC-ADD ON    Imaging Review Ct Head Wo Contrast  09/14/2014   CLINICAL DATA:  Seizure today. Hit back of head. Now with headache. History of epilepsy.  EXAM: CT HEAD WITHOUT CONTRAST  TECHNIQUE: Contiguous axial images were obtained from the base of the skull through the vertex without intravenous contrast.  COMPARISON:  07/09/2012  FINDINGS: Ventricles are normal in size and configuration.  There are no parenchymal masses or mass effect. There is no evidence of an infarct. There are no areas of abnormal parenchymal attenuation.  There are no extra-axial masses or abnormal fluid collections.  There is no intracranial hemorrhage.  No skull fracture. Visualized sinuses and mastoid air cells are clear.  IMPRESSION: Normal unenhanced CT scan of the brain.   Electronically Signed   By: Amie Portlandavid  Ormond M.D.   On: 09/14/2014 16:32     EKG Interpretation None      MDM   Final diagnoses:  Seizure  Noncompliance with medication regimen    Patient  with no evidence of focal neuro deficits on physical exam and is at mental baseline. Patient fully alert, answering questions appropriate in full, clear sentences, afebrile, non-tachycardic, nontachypneic, non-hypoxic, normotensive, well-appearing and in no acute distress throughout ER stay. CT head without evidence of injury or acute pathology.  Labs and imaging have been reviewed.  Phenytoin level is undetectable here, patient orally loaded with Dilantin 300 mg, second dose 2 hours later 300 mg based on pharmacy recommendations. Patient advised to continue his normal 300 mg dose of Dilantin once a day beginning tomorrow evening before bed. Patient verbalizes understanding and agreement of this. Social worker consult with patient regarding his use of the emergency room to fill his Dilantin. Case management help patient set up appointment over the [facility] wellness clinic for follow-up with his seizure disorder as well as follow-up to refill his medication. We'll refill patient's medication tonight. Patient is advised to followup with primary care provider in regards to today's event.  Spoke with patient and family in detail about driving restrictions until cleared by a neurologist.   Patient is hemodynamically stable and in no acute distress prior to discharge. Discussed return precautions with patient, patient verbalizes understanding and agreement with this plan. Encouraged patient to call or return to ER if any worsening symptoms or should any questions or concerns.  BP 123/81 mmHg  Pulse 71  Temp(Src) 97.9 F (36.6 C) (Oral)  Resp 17  SpO2 97%  Signed,  Ladona MowJoe Spike Desilets, PA-C 12:31 AM  Patient discussed with Dr. Tilden FossaElizabeth Rees, MD    Ladona MowJoe Clydette Privitera, PA-C 09/15/14 0032  Tilden FossaElizabeth Rees, MD 09/15/14 [phone]

## 2014-09-14 NOTE — ED Notes (Addendum)
Per EMS pt coming from home with c/o witnessed seizure, that lasted about 1 minute, per EMS pt was postictal on their arrival, upon arrival to department pt is A+Ox4. Pt also sts hasn't taken dilantin "in a while".

## 2014-09-14 NOTE — ED Notes (Signed)
Bed: WA19 Expected date:  Expected time:  Means of arrival:  Comments: EMS-SZ 

## 2014-09-14 NOTE — Progress Notes (Addendum)
CM spoke [place]. CM discussed and provided written information for self pay pcps, importance of pcp for f/u care, www.needymeds.org, www.goodrx.com, [place] [facility], discounted pharmacies and other [place] resources such as [facility], [facility], affordable care act,  financial assistance, DSS and  health department  Reviewed resources for [place] self pay pcps like [facility], family medicine at [address], [facility], general medical clinics, [facility] urgent care plus others, medication resources, [facility] out patient pharmacies and housing CM [facility], [facility] flags and prescription refills.  [facility] staff to get orange card services Pt voiced understanding and appreciation of resources provided   Provided [facility] contact information Pt agreed to referral Cm completed referral Pt confirms he has consulted Affordable care act staff in 2015 and had coverage but then lost one of his 2 jobs and his rate went from $45 to $125 Pt interested in [facility] but informs CM [place] and will not return until 09/18/14 Pt confirmed his address and contact numbers in EPIC are correct.   Pt discussed pmh of seizures and asthma. Cm encouraged [facility] visit ED PM Cm updated -pending appt for pt

## 2014-09-14 NOTE — Progress Notes (Signed)
ED CM consulted about pcp services for pt Cm to speak with pt about establishing care  [id number] CM left a voice message to attempt to obtain pt a [facility] appt

## 2014-09-14 NOTE — Discharge Instructions (Signed)
Seizure, Adult °A seizure is abnormal electrical activity in the brain. Seizures usually last from 30 seconds to 2 minutes. There are various types of seizures. °Before a seizure, you may have a warning sensation (aura) that a seizure is about to occur. An aura may include the following symptoms:  °· Fear or anxiety. °· Nausea. °· Feeling like the room is spinning (vertigo). °· Vision changes, such as seeing flashing lights or spots. °Common symptoms during a seizure include: °· A change in attention or behavior (altered mental status). °· Convulsions with rhythmic jerking movements. °· Drooling. °· Rapid eye movements. °· Grunting. °· Loss of bladder and bowel control. °· Bitter taste in the mouth. °· Tongue biting. °After a seizure, you may feel confused and sleepy. You may also have an injury resulting from convulsions during the seizure. °HOME CARE INSTRUCTIONS  °· If you are given medicines, take them exactly as prescribed by your health care provider. °· Keep all follow-up appointments as directed by your health care provider. °· Do not swim or drive or engage in risky activity during which a seizure could cause further injury to you or others until your health care provider says it is OK. °· Get adequate rest. °· Teach friends and family what to do if you have a seizure. They should: °¨ Lay you on the ground to prevent a fall. °¨ Put a cushion under your head. °¨ Loosen any tight clothing around your neck. °¨ Turn you on your side. If vomiting occurs, this helps keep your airway clear. °¨ Stay with you until you recover. °¨ Know whether or not you need emergency care. °SEEK IMMEDIATE MEDICAL CARE IF: °· The seizure lasts longer than 5 minutes. °· The seizure is severe or you do not wake up immediately after the seizure. °· You have an altered mental status after the seizure. °· You are having more frequent or worsening seizures. °Someone should drive you to the emergency department or call local emergency  services (911 in [place] °MAKE SURE YOU: °· Understand these instructions. °· Will watch your condition. °· Will get help right away if you are not doing well or get worse. °Document Released: 05/16/2000 Document Revised: 03/09/2013 Document Reviewed: 12/29/2012 °ExitCare® Patient Information ©2015 [employer]. This information is not intended to replace advice given to you by your health care provider. Make sure you discuss any questions you have with your health care provider. ° °Emergency Department Resource Guide °1) Find a Doctor and Pay Out of Pocket °Although you won't have to find out who is covered by your insurance plan, it is a good idea to ask around and get recommendations. You will then need to call the office and see if the doctor you have chosen will accept you as a new patient and what types of options they offer for patients who are self-pay. Some doctors offer discounts or will set up payment plans for their patients who do not have insurance, but you will need to ask so you aren't surprised when you get to your appointment. ° °2) Contact Your Local Health Department °Not all health departments have doctors that can see patients for sick visits, but many do, so it is worth a call to see if yours does. If you don't know where your local health department is, you can check in your phone book. The CDC also has a tool to help you locate your state's health department, and many state websites also have listings of all of their   local health departments. ° °3) Find a Walk-in Clinic °If your illness is not likely to be very severe or complicated, you may want to try a walk in clinic. These are popping up all over the country in pharmacies, drugstores, and shopping centers. They're usually staffed by nurse practitioners or physician assistants that have been trained to treat common illnesses and complaints. They're usually fairly quick and inexpensive. However, if you have serious medical issues or  chronic medical problems, these are probably not your best option. ° °No Primary Care Doctor: °- Call [facility] at  832-8000 - they can help you locate a primary care doctor that  accepts your insurance, provides certain services, etc. °- Physician Referral Service- 1-800-533-3463 ° °Chronic Pain Problems: °Organization         Address  Phone   Notes  °[facility]  (336) 297-2271 Patients need to be referred by their primary care doctor.  ° °Medication Assistance: °Organization         Address  Phone   Notes  °[place] [facility] [address] °[place], [place] [address] (336) 641-8030 --Must be a resident of [place] °-- Must have NO insurance coverage whatsoever (no Medicaid/ Medicare, etc.) °-- The pt. MUST have a primary care doctor that directs their care regularly and follows them in the community °  °[facility]  (866) 331-1348   °[employer]  (888) 892-1162   ° °Agencies that provide inexpensive medical care: °Organization         Address  Phone   Notes  °[facility]  (336) 832-8035   °[facility]    (336) 832-7272   °[facility] [address] °[place], [place] [address] (336) 832-4777   °[facility] [address], °[place] (336) 271-4999   °[facility]    (336) 373-0678   °[facility]    (336) 272-1050   °[facility] ° [address], [place] Phone:  (336) 832-4444, Fax:  (336) 832-4440 Hours of Operation:  9 am - 6 pm, M-F.  Also accepts Medicaid/Medicare and self-pay.  °[facility] ° [address], [place] Phone: (336) 832-3150, Fax: (336) 832-3151. Hours of Operation:  8:30 am - 5:30 pm, M-F.  Also accepts Medicaid and self-pay.  °[facility] [place] [address], [place] Phone: (336) 878-6027   °[facility] [address], [place], [place]  (336)723-1848, Ext. 123 Mondays & Thursdays: 7-9 AM.  First 15 patients are seen on a first come, first serve basis. °  ° °Medicaid-accepting [place] Providers: ° °Organization         Address  Phone   Notes  °[facility] [address], [place] (336) 641-2100 Also accepts self-pay patients.  °[facility] [address], [place] ° (336) 856-9996   °[facility] [address], [place] (336) 288-8857   °[facility] [address] [place] [address], [place] (336) 299-7000   °Veita Bland [address],   ° (336) 373-1557 Only accepts [place] Access Medicaid patients after they have their name applied to their card.  ° °Self-Pay (no insurance) in [place]: ° °Organization         Address  Phone   Notes  °Sickle Cell Patients,   [facility] [address], [place] (336) 832-1970   °[facility] [address], [place] (336) 832-4400   °[facility] ° [address], [place] (336) 992-4800   °[facility]/Dr. Osei-Bonsu ° [address] [place] [address], [place] or [address], [place] (336) 841-8500 Phone number for both [place] and [place] locations is the same.  °[facility] [address], [place] (336) 299-0000   °[facility] [address] [place] [address], [place] or [address] (336) 852-7530 °(336) 878-2260   °[facility] [address], [place] (336) 350-1642, phone; (336) 294-5005, fax Sees patients 1st and 3rd Saturday of every month.  Must not qualify for public or private insurance (i.e. Medicaid, Medicare, [place] Health Choice, Veterans' Benefits) • Household income should be no more than 200% of the poverty level •The clinic cannot treat you if you are pregnant or think you are pregnant • Sexually transmitted  diseases are not treated at the clinic.  ° ° °Dental Care: °Organization         Address  Phone  Notes  °[facility] [address], [place] (336) 641-6152 Accepts children up to age 21 who are enrolled in Medicaid or [place] Health Choice; pregnant women with a Medicaid card; and children who have applied for Medicaid or [place] Health Choice, but were declined, whose parents can pay a reduced fee at time of service.  °[facility] [place]  [address], [place] (336) 641-7733 Accepts children up to age 21 who are enrolled in Medicaid or [place] Health Choice; pregnant women with a Medicaid card; and children who have applied for Medicaid or [place] Health Choice, but were declined, whose parents can pay a reduced fee at time of service.  °[facility] ° [address], [place] (336) 641-4533 Patients are seen by appointment only. Walk-ins are not accepted. [facility] will see patients 18 years of age and older. °Monday - Tuesday (8am-5pm) °Most Wednesdays (8:30-5pm) °$30 per visit, cash only  °[facility] ° [address], [place] (336) 641-4533 Patients are seen by appointment only. Walk-ins are not accepted. [facility] will see patients 18 years of age and older. °One Wednesday Evening (Monthly: Volunteer Based).  $30 per visit, cash only  °[facility]  (919) 537-3737 for adults; Children under age 4, call Graduate Pediatric Dentistry at ([phone]) [phone]. Children aged 4-14, please call (919) [phone] to request a pediatric application. ° Dental services are provided in all areas of dental care including fillings, crowns and bridges, complete and partial dentures, implants, gum treatment, root canals, and extractions. Preventive care is also provided. Treatment is provided to both adults and children. °Patients are selected via a  lottery and there is often a waiting list. °  °[facility] [address], °[place] ° (336) 763-8833 [email or web address] °  °[facility] [address], [place], [place] (336)723-1848, Ext. 123 Second and Fourth Thursday of each month, opens at 6:30 AM; Clinic ends at 9 AM.  Patients are seen on a first-come first-served basis, and a limited number are seen during each clinic.  ° °[facility] ° [address], [place], [place] (336) 723-7904   Eligibility Requirements °You must   have lived in [place], or [place] counties for at least the last three months. °  You cannot be eligible for state or federal sponsored healthcare insurance, including Veterans Administration, Medicaid, or Medicare. °  You generally cannot be eligible for healthcare insurance through your employer.  °  How to apply: °Eligibility screenings are held every Tuesday and Wednesday afternoon from 1:00 pm until 4:00 pm. You do not need an appointment for the interview!  °[facility] [address], [place], [place] 336-631-2330   °[facility]  336-342-8273   °[facility]  336-703-3100   °[facility]  336-570-6415   ° °Behavioral Health Resources in the Community: °Intensive Outpatient Programs °Organization         Address  Phone  Notes  °[facility] [address], [place], [place] 336-878-6098   °[facility] [address], [place], [place] 336-832-9800   °[facility] [address], [place], [place] ° 336-882-2125   °[facility] [address],  °[place], [place] 1-800-853-5163 or 336-641-4981   °Substance Abuse Resources °Organization         Address  Phone  Notes  °[facility]  336-882-2125   °[facility]  336-784-9470   °[facility]  336-285-9073   °[facility]  336-845-3988   °Residential &  Outpatient Substance Abuse Program  1-800-659-3381   °Psychological Services °Organization         Address  Phone  Notes  °[facility]  336- 832-9600   °[facility]  336- 378-7881   °[facility] [address], [place] 1-800-853-5163 or 336-641-4981   ° °Mobile Crisis Teams °Organization         Address  Phone  Notes  °[facility], Mobile Crisis Care Unit  1-877-626-1772   °Assertive °[facility] ° [address] [place], [place] 336-834-9664   °Sharon DeEsch [address] °[place] 336-554-5454   ° °Self-Help/Support Groups °Organization         Address  Phone             Notes  °[facility] [place] - variety of support groups  336- 373-1402 Call for more information  °Narcotics Anonymous (NA), [facility] [address], °[place]  2 meetings at this location  ° °Residential Treatment Programs °Organization         Address  Phone  Notes  °[facility] [address],    °[place]  1-866-801-8205   °[facility] ° [address], [place], [place] 704-293-8524   °[facility] [address], [place] 336-845-3988 Admissions: 8am-3pm M-F  °[facility] [address]    °[place], [place] 336-841-1104   °[facility] [address], [place], [place] 336-379-7146   °[facility] [address]  °[place], [place] 336-285-9073   °[facility] Intensive Outpatient [address], [place], [place] 336-852-3033   °[facility] [address]  °[place], [place] 1-877-615-2722 or 336-784-9470   °[facility] [address] [place], [place] 336-227-7417 Accepts Medicaid  °[facility] [address]  °  1-800-659-3381 Substance Abuse/Addiction Treatment  ° °[place] Behavioral Health Resources °Organization          Address    Phone  Notes  °[facility]  (888) 581-9988   °Julie Brannon, PhD [address] [place], [place]   (336) 349-5553 or (336) 951-0000   °[facility]   [address] °[place], [place] (336) 349-4454   °[facility] [address], [place], [place] (336) 342-8316 Insurance/Medicaid/sponsorship through [facility]  °[facility] [address]                                    [place], [place] (336) 342-8316 Therapy/tele-psych/case  °[facility] [address]  ° [place], [place] (336) 349-2233    °Dr. Arfeen  (336) 349-4544   °[facility] [place]  [employer] [place] [facility] 1) [address], [place] °2) [address], [place] °3)  [address], [place] (336) 349-3220 °(336) 342-7768 ° °(336) 342-8140   °[place] [facility] (336) 342-1394 or (336) 342-3537 (After Hours)    ° ° °

## 2014-09-14 NOTE — Progress Notes (Signed)
EDCM spoke to patient at bedside.  Patient to be discharged on Dilantin capsules.  [facility], patient agreeable to copay.  [facility] asked [facility] if he had a ventolin inhaler?  Patient reports he does not have one.  EDCM asked EDPA to provide patient with RX for ventolin inhaler.  [facility], patient agreeable to copay.  [facility] with [facility] and contact information of [facility].  EDCM advised patient to call ASAP for an appointment.  Patient thankful for services.  No further EDCM needs at this time.

## 2014-10-20 ENCOUNTER — Encounter (HOSPITAL_COMMUNITY): Payer: Self-pay | Admitting: *Deleted

## 2014-10-20 ENCOUNTER — Emergency Department (HOSPITAL_COMMUNITY)
Admission: EM | Admit: 2014-10-20 | Discharge: 2014-10-21 | Disposition: A | Discharge status: Home / Self Care | Payer: Self-pay | Attending: Emergency Medicine | Admitting: Emergency Medicine

## 2014-10-20 ENCOUNTER — Emergency Department (HOSPITAL_COMMUNITY): Payer: Self-pay

## 2014-10-20 DIAGNOSIS — R569 Unspecified convulsions: Secondary | ICD-10-CM

## 2014-10-20 DIAGNOSIS — G40909 Epilepsy, unspecified, not intractable, without status epilepticus: Secondary | ICD-10-CM | POA: Insufficient documentation

## 2014-10-20 DIAGNOSIS — J45909 Unspecified asthma, uncomplicated: Secondary | ICD-10-CM | POA: Insufficient documentation

## 2014-10-20 DIAGNOSIS — Z79899 Other long term (current) drug therapy: Secondary | ICD-10-CM | POA: Insufficient documentation

## 2014-10-20 DIAGNOSIS — Z720 Tobacco use: Secondary | ICD-10-CM | POA: Insufficient documentation

## 2014-10-20 MED ORDER — PHENYTOIN SODIUM EXTENDED 100 MG PO CAPS: 400.0000 mg | ORAL_CAPSULE | Freq: Once | ORAL | Status: DC

## 2014-10-20 MED ORDER — PHENYTOIN SODIUM EXTENDED 100 MG PO CAPS: 300.0000 mg | ORAL_CAPSULE | Freq: Three times a day (TID) | ORAL | Status: DC

## 2014-10-20 NOTE — ED Notes (Signed)
EMS contacted d/t patient experiencing seizure (full grand mal) about 1 minute, while at work. Seizure witnessed by a customer patient began to shake and fell to the floor striking head. Pt conscious and a&o to name only, pt is now a&ox4. Patient refused IV access from EMS.  Hx of seizures last 2 weeks ago, no medication taken. Pt c/o headache in back of head

## 2014-10-20 NOTE — ED Notes (Signed)
Pt is in X-ray at this time.

## 2014-10-20 NOTE — Discharge Instructions (Signed)
Seizure, Adult Mr. Bruce Galvan, take your Dilantin medication as prescribed. See neurology within 3 days for close follow-up. If any symptoms worsen come back to emergency department immediately. Thank you. A seizure means there is unusual activity in the brain. A seizure can cause changes in attention or behavior. Seizures often cause shaking (convulsions). Seizures often last from 30 seconds to 2 minutes. HOME CARE   If you are given medicines, take them exactly as told by your doctor.  Keep all doctor visits as told.  Do not swim or drive until your doctor says it is okay.  Teach others what to do if you have a seizure. They should:  Lay you on the ground.  Put a cushion under your head.  Loosen any tight clothing around your neck.  Turn you on your side.  Stay with you until you get better. GET HELP RIGHT AWAY IF:   The seizure lasts longer than 2 to 5 minutes.  The seizure is very bad.  The person does not wake up after the seizure.  The person's attention or behavior changes. Drive the person to the emergency room or call your local emergency services (911 in [place] MAKE SURE YOU:   Understand these instructions.  Will watch your condition.  Will get help right away if you are not doing well or get worse. Document Released: 11/05/2007 Document Revised: 08/11/2011 Document Reviewed: 05/07/2011 [facility] Patient Information 2015 [place], [place]. This information is not intended to replace advice given to you by your health care provider. Make sure you discuss any questions you have with your health care provider.

## 2014-10-20 NOTE — ED Notes (Signed)
Bed: WA20 Expected date:  Expected time:  Means of arrival:  Comments: EMS-seizure 

## 2014-10-20 NOTE — ED Notes (Signed)
Patient is refusing IV access at this time. Education provided

## 2014-10-20 NOTE — ED Notes (Signed)
Writer asked pt if he would allow writer to draw any kind of blood work, pt sts that "the only reason we want blood is to test for dilantin level, which they will not find b/c the last time he took it was two days ago so it won't be in his system."  Environmental managerWriter notified RN

## 2014-10-20 NOTE — ED Provider Notes (Signed)
CSN: [id number]     Arrival date & time 10/20/14  2218 History   First MD Initiated Contact with Patient 10/20/14 2306     Chief Complaint  Patient presents with  . Seizures     (Consider location/radiation/quality/duration/timing/severity/associated sxs/prior Treatment) HPI  Bruce Galvan is a 29 y.o. male with past medical history of asthma and seizures visiting today for seizures. Patient states this occurred this evening while at work immediately prior to arrival. He states he just remembers passing out. He had bit his tongue as well. Patient describes a postictal state where he was confused for approximately 20-30 more minutes. He currently feels back to baseline. He has been out of his Dilantin medication for the past 6 months. During that time this has been his second seizure. He states he lost his medication and has not been able to get a refill. Patient denies any recent fevers or infections. He denies any nausea vomiting, diarrhea or dehydration recently. Patient is otherwise been in his normal state of health.    10 Systems reviewed and are negative for acute change except as noted in the HPI.     Past Medical History  Diagnosis Date  . Asthma   . Seizure    History reviewed. No pertinent past surgical history. Family History  Problem Relation Age of Onset  . Cancer Other    History  Substance Use Topics  . Smoking status: Current Every Day Smoker -- 0.75 packs/day    Types: Cigarettes  . Smokeless tobacco: Not on file  . Alcohol Use: No     Comment: denies 08/05/2014    Review of Systems    Allergies  Review of patient's allergies indicates no known allergies.  Home Medications   Prior to Admission medications   Medication Sig Start Date End Date Taking? Authorizing Provider  albuterol (PROVENTIL HFA;VENTOLIN HFA) 108 (90 BASE) MCG/ACT inhaler Inhale 1-2 puffs into the lungs every 6 (six) hours as needed for wheezing or shortness of breath. 09/14/14   Ladona MowJoe  Mintz, PA-C  amoxicillin (AMOXIL) 500 MG capsule Take 1 capsule (500 mg total) by mouth 3 (three) times daily. Patient not taking: Reported on 08/05/2014 06/30/14   Joni ReiningNicole Pisciotta, PA-C  HYDROcodone-acetaminophen (NORCO/VICODIN) 5-325 MG per tablet Take 1-2 tablets by mouth every 6 hours as needed for pain and/or cough. Patient not taking: Reported on 08/05/2014 06/30/14   Joni ReiningNicole Pisciotta, PA-C  ibuprofen (ADVIL,MOTRIN) 200 MG tablet Take 400 mg by mouth every 6 (six) hours as needed for headache (headache).     Historical Provider, MD  phenytoin (DILANTIN) 100 MG ER capsule Take 3 capsules (300 mg total) by mouth daily. At bedtime 09/14/14   Ladona MowJoe Mintz, PA-C  predniSONE (DELTASONE) 20 MG tablet Take 3 tablets (60 mg total) by mouth daily. Patient not taking: Reported on 06/30/2014 05/30/14   Santiago GladHeather Laisure, PA-C  traMADol (ULTRAM) 50 MG tablet Take 1 tablet (50 mg total) by mouth every 6 (six) hours as needed. Patient not taking: Reported on 06/30/2014 05/30/14   Heather Laisure, PA-C   BP 120/84 mmHg  Pulse 105  Temp(Src) 98 F (36.7 C)  Resp 13  Ht 5\' 10"  (1.778 m)  Wt 130 lb (58.968 kg)  BMI 18.65 kg/m2  SpO2 93% Physical Exam  Constitutional: He is oriented to person, place, and time. Vital signs are normal. He appears well-developed and well-nourished.  Non-toxic appearance. He does not appear ill. No distress.  HENT:  Head: Normocephalic and atraumatic.  Nose:  Nose normal.  Mouth/Throat: Oropharynx is clear and moist. No oropharyngeal exudate.  Eyes: Conjunctivae and EOM are normal. Pupils are equal, round, and reactive to light. No scleral icterus.  Neck: Normal range of motion. Neck supple. No tracheal deviation, no edema, no erythema and normal range of motion present. No thyroid mass and no thyromegaly present.  Cardiovascular: Normal rate, regular rhythm, S1 normal, S2 normal, normal heart sounds, intact distal pulses and normal pulses.  Exam reveals no gallop and no friction rub.    No murmur heard. Pulses:      Radial pulses are 2+ on the right side, and 2+ on the left side.       Dorsalis pedis pulses are 2+ on the right side, and 2+ on the left side.  Pulmonary/Chest: Effort normal and breath sounds normal. No respiratory distress. He has no wheezes. He has no rhonchi. He has no rales.  Abdominal: Soft. Normal appearance and bowel sounds are normal. He exhibits no distension, no ascites and no mass. There is no hepatosplenomegaly. There is no tenderness. There is no rebound, no guarding and no CVA tenderness.  Musculoskeletal: Normal range of motion. He exhibits no edema or tenderness.  Lymphadenopathy:    He has no cervical adenopathy.  Neurological: He is alert and oriented to person, place, and time. He has normal strength. No cranial nerve deficit or sensory deficit. He exhibits normal muscle tone.  Normal strength and sensation in all extremity. Normal cerebellar testing, normal gait.  Skin: Skin is warm, dry and intact. No petechiae and no rash noted. He is not diaphoretic. No erythema. No pallor.  Psychiatric: He has a normal mood and affect. His behavior is normal. Judgment normal.  Nursing note and vitals reviewed.   ED Course  Procedures (including critical care time) Labs Review Labs Reviewed - No data to display  Imaging Review No results found.   EKG Interpretation   Date/Time:  Friday Oct 20 2014 22:29:00 EDT Ventricular Rate:  103 PR Interval:  140 QRS Duration: 103 QT Interval:  339 QTC Calculation: 444 R Axis:   81 Text Interpretation:  Sinus tachycardia RSR' in V1 or V2, right VCD or RVH  ST elev, probable normal early repol pattern No significant change since  last tracing Confirmed by Erroll Lunani, Adore Kithcart Ayokunle [phone]([id number]) on 10/20/2014  11:05:37 PM      MDM   Final diagnoses:  Seizure   patient since emergency department for seizure. This is likely due to noncompliance with medications. Patient is refusing any lab draw or further  workup. I do not believe further workup is indicated in this case either. He is otherwise been his normal state of health. Patient will be given Dilantin 400 mg in the emergency department and prescription will be filled for him. Patient is requesting neurology follow-up as well which was provided to him. He was noted to have tachycardia per triage vitals. Upon my assessment there is no tachycardia noted. This resolved without any intervention. His vital signs were within his normal limits, patient is in no acute distress and safe for discharge.  Tomasita CrumbleAdeleke Daisean Brodhead, MD 10/20/14 [id number]

## 2015-01-17 ENCOUNTER — Emergency Department (HOSPITAL_COMMUNITY)
Admission: EM | Admit: 2015-01-17 | Discharge: 2015-01-17 | Disposition: A | Discharge status: Home / Self Care | Payer: Self-pay | Attending: Emergency Medicine | Admitting: Emergency Medicine

## 2015-01-17 ENCOUNTER — Encounter (HOSPITAL_COMMUNITY): Payer: Self-pay | Admitting: *Deleted

## 2015-01-17 DIAGNOSIS — Z720 Tobacco use: Secondary | ICD-10-CM | POA: Insufficient documentation

## 2015-01-17 DIAGNOSIS — G40909 Epilepsy, unspecified, not intractable, without status epilepticus: Secondary | ICD-10-CM | POA: Insufficient documentation

## 2015-01-17 DIAGNOSIS — J45901 Unspecified asthma with (acute) exacerbation: Secondary | ICD-10-CM | POA: Insufficient documentation

## 2015-01-17 DIAGNOSIS — J45909 Unspecified asthma, uncomplicated: Secondary | ICD-10-CM

## 2015-01-17 DIAGNOSIS — Z79899 Other long term (current) drug therapy: Secondary | ICD-10-CM | POA: Insufficient documentation

## 2015-01-17 MED ORDER — ALBUTEROL SULFATE HFA 108 (90 BASE) MCG/ACT IN AERS
2.0000 | INHALATION_SPRAY | Freq: Once | RESPIRATORY_TRACT | Status: AC
  Administered 2015-01-17: 2 via RESPIRATORY_TRACT
  Filled 2015-01-17: qty 6.7

## 2015-01-17 MED ORDER — ALBUTEROL SULFATE HFA 108 (90 BASE) MCG/ACT IN AERS: 1.0000 | INHALATION_SPRAY | Freq: Four times a day (QID) | RESPIRATORY_TRACT | Status: DC | PRN

## 2015-01-17 NOTE — ED Provider Notes (Signed)
History  This chart was scribed for non-physician practitioner, Mayme Genta, PA-C,working with Lavera Guise, MD, by Karle Plumber, ED Scribe. This patient was seen in room TR07C/TR07C and the patient's care was started at 9:30 PM.  Chief Complaint  Patient presents with  . Asthma   The history is provided by the patient and medical records. No language interpreter was used.    HPI Comments:  Bruce Galvan is a 29 y.o. male who presents to the Emergency Department complaining of wheezing onset earlier this morning. He states he ran out of his MDI. He denies using a maintenance inhaler and only reports using the MDI as needed. He states he believes the wheezing was brought on due to the rain. He denies any wheezing now in the ED. He denies modifying factors. He denies SOB, cough, fever, chills, nausea or vomiting.   Past Medical History  Diagnosis Date  . Asthma   . Seizure    History reviewed. No pertinent past surgical history. Family History  Problem Relation Age of Onset  . Cancer Other    Social History  Substance Use Topics  . Smoking status: Current Every Day Smoker -- 0.75 packs/day    Types: Cigarettes  . Smokeless tobacco: None  . Alcohol Use: No     Comment: denies 08/05/2014    Review of Systems  Constitutional: Negative for fever and chills.  Respiratory: Positive for wheezing. Negative for shortness of breath.   Gastrointestinal: Negative for nausea and vomiting.  All other systems reviewed and are negative.   Allergies  Review of patient's allergies indicates no known allergies.  Home Medications   Prior to Admission medications   Medication Sig Start Date End Date Taking? Authorizing Provider  ibuprofen (ADVIL,MOTRIN) 200 MG tablet Take 400 mg by mouth every 6 (six) hours as needed for headache (headache).    Yes Historical Provider, MD  phenytoin (DILANTIN) 100 MG ER capsule Take 3 capsules (300 mg total) by mouth 3 (three) times daily. 10/20/14  Yes  Tomasita Crumble, MD  albuterol (PROVENTIL HFA;VENTOLIN HFA) 108 (90 BASE) MCG/ACT inhaler Inhale 1-2 puffs into the lungs every 6 (six) hours as needed for wheezing or shortness of breath. 01/17/15   Joycie Peek, PA-C   Triage Vitals: BP 133/72 mmHg  Pulse 80  Temp(Src) 98.1 F (36.7 C)  Resp 16  Ht  (1.753 m)  Wt 129 lb 7 oz (58.712 kg)  BMI 19.11 kg/m2  SpO2 98% Physical Exam  Constitutional: He is oriented to person, place, and time. He appears well-developed and well-nourished.  HENT:  Head: Normocephalic and atraumatic.  Eyes: EOM are normal.  Neck: Normal range of motion.  Cardiovascular: Normal rate, regular rhythm and normal heart sounds.   No murmur heard. Pulmonary/Chest: Effort normal and breath sounds normal. No respiratory distress. He has no wheezes.  Abdominal: Soft. There is no tenderness.  Musculoskeletal: Normal range of motion.  Neurological: He is alert and oriented to person, place, and time.  Skin: Skin is warm and dry.  Psychiatric: He has a normal mood and affect. His behavior is normal.  Nursing note and vitals reviewed.   ED Course  Procedures (including critical care time) DIAGNOSTIC STUDIES: Oxygen Saturation is 98% on RA, normal by my interpretation.   COORDINATION OF CARE: 9:33 PM- Will order ProAir inhaler. Pt verbalizes understanding and agrees to plan.  Medications  albuterol (PROVENTIL HFA;VENTOLIN HFA) 108 (90 BASE) MCG/ACT inhaler 2 puff (not administered)    Labs  Review Labs Reviewed - No data to display  Imaging Review No results found. I have personally reviewed and evaluated these images and lab results as part of my medical decision-making.   EKG Interpretation None     Meds given in ED:  Medications  albuterol (PROVENTIL HFA;VENTOLIN HFA) 108 (90 BASE) MCG/ACT inhaler 2 puff (not administered)    New Prescriptions   No medications on file   Filed Vitals:   01/17/15 2120  BP: 133/72  Pulse: 80  Temp: 98.1  F (36.7 C)  Resp: 16  Height: 5\' 9"  (1.753 m)  Weight: 129 lb 7 oz (58.712 kg)  SpO2: 98%    MDM  Vitals stable - WNL -afebrile Pt resting comfortably in ED. Patient here with previously diagnosed asthma here with exacerbation similar to previous episodes.  PE--normal lung exam. Physical exam otherwise benign. Patient given inhaler in the ED. Also given prescription for outpatient inhaler.  Referral given for [facility] and wellness the patient may establish primary care. I discussed all relevant lab findings and imaging results with pt and they verbalized understanding. Discussed f/u with PCP within 48 hrs and return precautions, pt very amenable to plan.  Final diagnoses:  Asthma, unspecified asthma severity, uncomplicated    I personally performed the services described in this documentation, which was scribed in my presence. The recorded information has been reviewed and is accurate.      Joycie Peek, PA-C 01/17/15 2143  Lavera Guise, MD 01/18/15 [phone]

## 2015-01-17 NOTE — ED Notes (Signed)
The pt is here because he ran out of his iunhaler earlier today.  He has asthma but no audible wheezes.  No resp distress

## 2015-01-17 NOTE — ED Notes (Signed)
Pt left at this time with all belongings.  

## 2015-01-17 NOTE — Discharge Instructions (Signed)
Please follow-up with your doctors as needed for further evaluation and management of asthma. He may follow-up with [facility] and wellness to establish definitive primary care. Return to ED for worsening symptoms.  Asthma Asthma is a recurring condition in which the airways tighten and narrow. Asthma can make it difficult to breathe. It can cause coughing, wheezing, and shortness of breath. Asthma episodes, also called asthma attacks, range from minor to life-threatening. Asthma cannot be cured, but medicines and lifestyle changes can help control it. CAUSES Asthma is believed to be caused by inherited (genetic) and environmental factors, but its exact cause is unknown. Asthma may be triggered by allergens, lung infections, or irritants in the air. Asthma triggers are different for each person. Common triggers include:   Animal dander.  Dust mites.  Cockroaches.  Pollen from trees or grass.  Mold.  Smoke.  Air pollutants such as dust, household cleaners, hair sprays, aerosol sprays, paint fumes, strong chemicals, or strong odors.  Cold air, weather changes, and winds (which increase molds and pollens in the air).  Strong emotional expressions such as crying or laughing hard.  Stress.  Certain medicines (such as aspirin) or types of drugs (such as beta-blockers).  Sulfites in foods and drinks. Foods and drinks that may contain sulfites include dried fruit, potato chips, and sparkling grape juice.  Infections or inflammatory conditions such as the flu, a cold, or an inflammation of the nasal membranes (rhinitis).  Gastroesophageal reflux disease (GERD).  Exercise or strenuous activity. SYMPTOMS Symptoms may occur immediately after asthma is triggered or many hours later. Symptoms include:  Wheezing.  Excessive nighttime or early morning coughing.  Frequent or severe coughing with a common cold.  Chest tightness.  Shortness of breath. DIAGNOSIS  The diagnosis of asthma  is made by a review of your medical history and a physical exam. Tests may also be performed. These may include:  Lung function studies. These tests show how much air you breathe in and out.  Allergy tests.  Imaging tests such as X-rays. TREATMENT  Asthma cannot be cured, but it can usually be controlled. Treatment involves identifying and avoiding your asthma triggers. It also involves medicines. There are 2 classes of medicine used for asthma treatment:   Controller medicines. These prevent asthma symptoms from occurring. They are usually taken every day.  Reliever or rescue medicines. These quickly relieve asthma symptoms. They are used as needed and provide short-term relief. Your health care provider will help you create an asthma action plan. An asthma action plan is a written plan for managing and treating your asthma attacks. It includes a list of your asthma triggers and how they may be avoided. It also includes information on when medicines should be taken and when their dosage should be changed. An action plan may also involve the use of a device called a peak flow meter. A peak flow meter measures how well the lungs are working. It helps you monitor your condition. HOME CARE INSTRUCTIONS   Take medicines only as directed by your health care provider. Speak with your health care provider if you have questions about how or when to take the medicines.  Use a peak flow meter as directed by your health care provider. Record and keep track of readings.  Understand and use the action plan to help minimize or stop an asthma attack without needing to seek medical care.  Control your home environment in the following ways to help prevent asthma attacks:  Do not  smoke. Avoid being exposed to secondhand smoke.  Change your heating and air conditioning filter regularly.  Limit your use of fireplaces and wood stoves.  Get rid of pests (such as roaches and mice) and their  droppings.  Throw away plants if you see mold on them.  Clean your floors and dust regularly. Use unscented cleaning products.  Try to have someone else vacuum for you regularly. Stay out of rooms while they are being vacuumed and for a short while afterward. If you vacuum, use a dust mask from a hardware store, a double-layered or microfilter vacuum cleaner bag, or a vacuum cleaner with a HEPA filter.  Replace carpet with wood, tile, or vinyl flooring. Carpet can trap dander and dust.  Use allergy-proof pillows, mattress covers, and box spring covers.  Wash bed sheets and blankets every week in hot water and dry them in a dryer.  Use blankets that are made of polyester or cotton.  Clean bathrooms and kitchens with bleach. If possible, have someone repaint the walls in these rooms with mold-resistant paint. Keep out of the rooms that are being cleaned and painted.  Wash hands frequently. SEEK MEDICAL CARE IF:   You have wheezing, shortness of breath, or a cough even if taking medicine to prevent attacks.  The colored mucus you cough up (sputum) is thicker than usual.  Your sputum changes from clear or white to yellow, green, gray, or bloody.  You have any problems that may be related to the medicines you are taking (such as a rash, itching, swelling, or trouble breathing).  You are using a reliever medicine more than 2-3 times per week.  Your peak flow is still at 50-79% of your personal best after following your action plan for 1 hour.  You have a fever. SEEK IMMEDIATE MEDICAL CARE IF:   You seem to be getting worse and are unresponsive to treatment during an asthma attack.  You are short of breath even at rest.  You get short of breath when doing very little physical activity.  You have difficulty eating, drinking, or talking due to asthma symptoms.  You develop chest pain.  You develop a fast heartbeat.  You have a bluish color to your lips or fingernails.  You  are light-headed, dizzy, or faint.  Your peak flow is less than 50% of your personal best. MAKE SURE YOU:   Understand these instructions.  Will watch your condition.  Will get help right away if you are not doing well or get worse. Document Released: 05/19/2005 Document Revised: 10/03/2013 Document Reviewed: 12/16/2012 [facility] Patient Information 2015 [place], [place]. This information is not intended to replace advice given to you by your health care provider. Make sure you discuss any questions you have with your health care provider.

## 2015-01-22 ENCOUNTER — Emergency Department (HOSPITAL_COMMUNITY): Payer: Self-pay

## 2015-01-22 ENCOUNTER — Encounter (HOSPITAL_COMMUNITY): Payer: Self-pay

## 2015-01-22 ENCOUNTER — Emergency Department (HOSPITAL_COMMUNITY)
Admission: EM | Admit: 2015-01-22 | Discharge: 2015-01-22 | Disposition: A | Discharge status: Home / Self Care | Payer: Self-pay | Attending: Emergency Medicine | Admitting: Emergency Medicine

## 2015-01-22 DIAGNOSIS — Z720 Tobacco use: Secondary | ICD-10-CM | POA: Insufficient documentation

## 2015-01-22 DIAGNOSIS — G40909 Epilepsy, unspecified, not intractable, without status epilepticus: Secondary | ICD-10-CM | POA: Insufficient documentation

## 2015-01-22 DIAGNOSIS — J4521 Mild intermittent asthma with (acute) exacerbation: Secondary | ICD-10-CM | POA: Insufficient documentation

## 2015-01-22 DIAGNOSIS — Z79899 Other long term (current) drug therapy: Secondary | ICD-10-CM | POA: Insufficient documentation

## 2015-01-22 LAB — RAPID STREP SCREEN (MED CTR MEBANE ONLY): Streptococcus, Group A Screen (Direct): NEGATIVE

## 2015-01-22 MED ORDER — PREDNISONE 20 MG PO TABS
60.0000 mg | ORAL_TABLET | Freq: Once | ORAL | Status: AC
  Administered 2015-01-22: 60 mg via ORAL
  Filled 2015-01-22: qty 3

## 2015-01-22 MED ORDER — IPRATROPIUM BROMIDE 0.02 % IN SOLN
0.5000 mg | Freq: Once | RESPIRATORY_TRACT | Status: AC
  Administered 2015-01-22: 0.5 mg via RESPIRATORY_TRACT
  Filled 2015-01-22: qty 2.5

## 2015-01-22 MED ORDER — ALBUTEROL (5 MG/ML) CONTINUOUS INHALATION SOLN
10.0000 mg/h | INHALATION_SOLUTION | Freq: Once | RESPIRATORY_TRACT | Status: AC
  Administered 2015-01-22: 10 mg/h via RESPIRATORY_TRACT
  Filled 2015-01-22: qty 20

## 2015-01-22 MED ORDER — ALBUTEROL (5 MG/ML) CONTINUOUS INHALATION SOLN
INHALATION_SOLUTION | RESPIRATORY_TRACT | Status: AC
  Filled 2015-01-22: qty 20

## 2015-01-22 MED ORDER — PREDNISONE 10 MG PO TABS: 60.0000 mg | ORAL_TABLET | Freq: Every day | ORAL | Status: DC

## 2015-01-22 NOTE — ED Notes (Signed)
Has asthma and states it started bothering him last night and has a cough and is coughing up yellow mucous. Has been using his proair

## 2015-01-22 NOTE — ED Provider Notes (Addendum)
CSN: [id number]     Arrival date & time 01/22/15  1944 History  This chart was scribed for Derwood Kaplan, MD by Murriel Hopper, ED Scribe. This patient was seen in room TR08C/TR08C and the patient's care was started at 8:10 PM.    Chief Complaint  Patient presents with  . Asthma  . Cough    The history is provided by the patient. No language interpreter was used.   HPI Comments: Bruce Galvan is a 29 y.o. male with a hx of asthma and seizures who presents to the Emergency Department complaining of intermittent asthma problems as well as a worsening productive cough that has been present for a few days. Pt states he was seen in ED a few days ago for asthma, and given an inhaler for his symptoms. Pt reports that his asthma began bothering him again last night and states that he is coughing up yellow phlegm. Pt states his cough feels like a burning sensation and states it hurts. Pt states he has used his inhaler 4x today. Pt is a smoker and says he smokes a pack per day. Pt denies fevers or chills.    Past Medical History  Diagnosis Date  . Asthma   . Seizure    History reviewed. No pertinent past surgical history. Family History  Problem Relation Age of Onset  . Cancer Other    Social History  Substance Use Topics  . Smoking status: Current Every Day Smoker -- 0.75 packs/day    Types: Cigarettes  . Smokeless tobacco: None  . Alcohol Use: No     Comment: denies 08/05/2014    Review of Systems  Constitutional: Negative for fever and chills.  Respiratory: Positive for cough and shortness of breath.   Cardiovascular: Negative for chest pain.  Hematological: Negative for adenopathy.      Allergies  Review of patient's allergies indicates no known allergies.  Home Medications   Prior to Admission medications   Medication Sig Start Date End Date Taking? Authorizing Provider  albuterol (PROVENTIL HFA;VENTOLIN HFA) 108 (90 BASE) MCG/ACT inhaler Inhale 1-2 puffs into the lungs  every 6 (six) hours as needed for wheezing or shortness of breath. 01/17/15   Joycie Peek, PA-C  ibuprofen (ADVIL,MOTRIN) 200 MG tablet Take 400 mg by mouth every 6 (six) hours as needed for headache (headache).     Historical Provider, MD  phenytoin (DILANTIN) 100 MG ER capsule Take 3 capsules (300 mg total) by mouth 3 (three) times daily. 10/20/14   Tomasita Crumble, MD  predniSONE (DELTASONE) 10 MG tablet Take 6 tablets (60 mg total) by mouth daily. 01/22/15   Sylena Lotter Rhunette Croft, MD   BP 149/87 mmHg  Pulse 75  Temp(Src) 97.8 F (36.6 C) (Oral)  Resp 18  Ht  (1.753 m)  Wt 129 lb 11.2 oz (58.832 kg)  BMI 19.14 kg/m2  SpO2 99% Physical Exam  Constitutional: He is oriented to person, place, and time. He appears well-developed and well-nourished.  HENT:  Head: Normocephalic and atraumatic.  Cardiovascular: Normal rate and regular rhythm.   Pulmonary/Chest: Effort normal. He has wheezes. He has no rales.  Diffuse wheezing  Abdominal: He exhibits no distension.  Neurological: He is alert and oriented to person, place, and time.  Skin: Skin is warm and dry.  Psychiatric: He has a normal mood and affect.  Nursing note and vitals reviewed.   ED Course  Procedures (including critical care time)  DIAGNOSTIC STUDIES: Oxygen Saturation is 97% on room air,  normal by my interpretation.    COORDINATION OF CARE: 8:12 PM Discussed treatment plan with pt at bedside and pt agreed to plan.   Labs Review Labs Reviewed  RAPID STREP SCREEN (NOT AT [facility])  CULTURE, GROUP A STREP    Imaging Review Dg Chest 2 View  01/22/2015   CLINICAL DATA:  Cough today.  History of bronchitis.  Smoker.  EXAM: CHEST  2 VIEW  COMPARISON:  10/20/2014  FINDINGS: Mild hyperinflation. The heart size and mediastinal contours are within normal limits. Both lungs are clear. The visualized skeletal structures are unremarkable.  IMPRESSION: No active cardiopulmonary disease.   Electronically Signed   By: Burman Nieves  M.D.   On: 01/22/2015 20:45   I have personally reviewed and evaluated these images and lab results as part of my medical decision-making.   EKG Interpretation None      @10 :39 - lungs are clear. MDM   Final diagnoses:  Acute asthma flare, mild intermittent   I personally performed the services described in this documentation, which was scribed in my presence. The recorded information has been reviewed and is accurate.  Pt comes in with cc of wheezing. He is noted to be wheezing. + new cough - xrays ordered.  Smoking cessation instruction/counseling given:  counseled patient on the dangers of tobacco use, advised patient to stop smoking, and reviewed strategies to maximize success. Discussion 2-3 min    Derwood Kaplan, MD 01/22/15 2841  Derwood Kaplan, MD 01/22/15 3244  Derwood Kaplan, MD 01/22/15 2235

## 2015-01-22 NOTE — Discharge Instructions (Signed)
We saw you in the ER for your asthma related complains. We gave you some breathing treatments in the ER, and seems like your symptoms have improved. Please take albuterol as needed every 4 hours. Please take the medications prescribed. Please refrain from smoking or smoke exposure. Please see a primary care doctor in 1 week. Return to the ER if your symptoms worsen.   Asthma Asthma is a recurring condition in which the airways tighten and narrow. Asthma can make it difficult to breathe. It can cause coughing, wheezing, and shortness of breath. Asthma episodes, also called asthma attacks, range from minor to life-threatening. Asthma cannot be cured, but medicines and lifestyle changes can help control it. CAUSES Asthma is believed to be caused by inherited (genetic) and environmental factors, but its exact cause is unknown. Asthma may be triggered by allergens, lung infections, or irritants in the air. Asthma triggers are different for each person. Common triggers include:   Animal dander.  Dust mites.  Cockroaches.  Pollen from trees or grass.  Mold.  Smoke.  Air pollutants such as dust, household cleaners, hair sprays, aerosol sprays, paint fumes, strong chemicals, or strong odors.  Cold air, weather changes, and winds (which increase molds and pollens in the air).  Strong emotional expressions such as crying or laughing hard.  Stress.  Certain medicines (such as aspirin) or types of drugs (such as beta-blockers).  Sulfites in foods and drinks. Foods and drinks that may contain sulfites include dried fruit, potato chips, and sparkling grape juice.  Infections or inflammatory conditions such as the flu, a cold, or an inflammation of the nasal membranes (rhinitis).  Gastroesophageal reflux disease (GERD).  Exercise or strenuous activity. SYMPTOMS Symptoms may occur immediately after asthma is triggered or many hours later. Symptoms include:  Wheezing.  Excessive  nighttime or early morning coughing.  Frequent or severe coughing with a common cold.  Chest tightness.  Shortness of breath. DIAGNOSIS  The diagnosis of asthma is made by a review of your medical history and a physical exam. Tests may also be performed. These may include:  Lung function studies. These tests show how much air you breathe in and out.  Allergy tests.  Imaging tests such as X-rays. TREATMENT  Asthma cannot be cured, but it can usually be controlled. Treatment involves identifying and avoiding your asthma triggers. It also involves medicines. There are 2 classes of medicine used for asthma treatment:   Controller medicines. These prevent asthma symptoms from occurring. They are usually taken every day.  Reliever or rescue medicines. These quickly relieve asthma symptoms. They are used as needed and provide short-term relief. Your health care provider will help you create an asthma action plan. An asthma action plan is a written plan for managing and treating your asthma attacks. It includes a list of your asthma triggers and how they may be avoided. It also includes information on when medicines should be taken and when their dosage should be changed. An action plan may also involve the use of a device called a peak flow meter. A peak flow meter measures how well the lungs are working. It helps you monitor your condition. HOME CARE INSTRUCTIONS   Take medicines only as directed by your health care provider. Speak with your health care provider if you have questions about how or when to take the medicines.  Use a peak flow meter as directed by your health care provider. Record and keep track of readings.  Understand and use the action  plan to help minimize or stop an asthma attack without needing to seek medical care.  Control your home environment in the following ways to help prevent asthma attacks:  Do not smoke. Avoid being exposed to secondhand smoke.  Change your  heating and air conditioning filter regularly.  Limit your use of fireplaces and wood stoves.  Get rid of pests (such as roaches and mice) and their droppings.  Throw away plants if you see mold on them.  Clean your floors and dust regularly. Use unscented cleaning products.  Try to have someone else vacuum for you regularly. Stay out of rooms while they are being vacuumed and for a short while afterward. If you vacuum, use a dust mask from a hardware store, a double-layered or microfilter vacuum cleaner bag, or a vacuum cleaner with a HEPA filter.  Replace carpet with wood, tile, or vinyl flooring. Carpet can trap dander and dust.  Use allergy-proof pillows, mattress covers, and box spring covers.  Wash bed sheets and blankets every week in hot water and dry them in a dryer.  Use blankets that are made of polyester or cotton.  Clean bathrooms and kitchens with bleach. If possible, have someone repaint the walls in these rooms with mold-resistant paint. Keep out of the rooms that are being cleaned and painted.  Wash hands frequently. SEEK MEDICAL CARE IF:   You have wheezing, shortness of breath, or a cough even if taking medicine to prevent attacks.  The colored mucus you cough up (sputum) is thicker than usual.  Your sputum changes from clear or white to yellow, green, gray, or bloody.  You have any problems that may be related to the medicines you are taking (such as a rash, itching, swelling, or trouble breathing).  You are using a reliever medicine more than 2-3 times per week.  Your peak flow is still at 50-79% of your personal best after following your action plan for 1 hour.  You have a fever. SEEK IMMEDIATE MEDICAL CARE IF:   You seem to be getting worse and are unresponsive to treatment during an asthma attack.  You are short of breath even at rest.  You get short of breath when doing very little physical activity.  You have difficulty eating, drinking, or  talking due to asthma symptoms.  You develop chest pain.  You develop a fast heartbeat.  You have a bluish color to your lips or fingernails.  You are light-headed, dizzy, or faint.  Your peak flow is less than 50% of your personal best. MAKE SURE YOU:   Understand these instructions.  Will watch your condition.  Will get help right away if you are not doing well or get worse. Document Released: 05/19/2005 Document Revised: 10/03/2013 Document Reviewed: 12/16/2012 [facility] Patient Information 2015 [place], [place]. This information is not intended to replace advice given to you by your health care provider. Make sure you discuss any questions you have with your health care provider.  Asthma Attack Prevention Although there is no way to prevent asthma from starting, you can take steps to control the disease and reduce its symptoms. Learn about your asthma and how to control it. Take an active role to control your asthma by working with your health care provider to create and follow an asthma action plan. An asthma action plan guides you in:  Taking your medicines properly.  Avoiding things that set off your asthma or make your asthma worse (asthma triggers).  Tracking your level of asthma  control.  Responding to worsening asthma.  Seeking emergency care when needed. To track your asthma, keep records of your symptoms, check your peak flow number using a handheld device that shows how well air moves out of your lungs (peak flow meter), and get regular asthma checkups.  WHAT ARE SOME WAYS TO PREVENT AN ASTHMA ATTACK?  Take medicines as directed by your health care provider.  Keep track of your asthma symptoms and level of control.  With your health care provider, write a detailed plan for taking medicines and managing an asthma attack. Then be sure to follow your action plan. Asthma is an ongoing condition that needs regular monitoring and treatment.  Identify and avoid asthma  triggers. Many outdoor allergens and irritants (such as pollen, mold, cold air, and air pollution) can trigger asthma attacks. Find out what your asthma triggers are and take steps to avoid them.  Monitor your breathing. Learn to recognize warning signs of an attack, such as coughing, wheezing, or shortness of breath. Your lung function may decrease before you notice any signs or symptoms, so regularly measure and record your peak airflow with a home peak flow meter.  Identify and treat attacks early. If you act quickly, you are less likely to have a severe attack. You will also need less medicine to control your symptoms. When your peak flow measurements decrease and alert you to an upcoming attack, take your medicine as instructed and immediately stop any activity that may have triggered the attack. If your symptoms do not improve, get medical help.  Pay attention to increasing quick-relief inhaler use. If you find yourself relying on your quick-relief inhaler, your asthma is not under control. See your health care provider about adjusting your treatment. WHAT CAN MAKE MY SYMPTOMS WORSE? A number of common things can set off or make your asthma symptoms worse and cause temporary increased inflammation of your airways. Keep track of your asthma symptoms for several weeks, detailing all the environmental and emotional factors that are linked with your asthma. When you have an asthma attack, go back to your asthma diary to see which factor, or combination of factors, might have contributed to it. Once you know what these factors are, you can take steps to control many of them. If you have allergies and asthma, it is important to take asthma prevention steps at home. Minimizing contact with the substance to which you are allergic will help prevent an asthma attack. Some triggers and ways to avoid these triggers are: Animal Dander:  Some people are allergic to the flakes of skin or dried saliva from animals  with fur or feathers.   There is no such thing as a hypoallergenic dog or cat breed. All dogs or cats can cause allergies, even if they don't shed.  Keep these pets out of your home.  If you are not able to keep a pet outdoors, keep the pet out of your bedroom and other sleeping areas at all times, and keep the door closed.  Remove carpets and furniture covered with cloth from your home. If that is not possible, keep the pet away from fabric-covered furniture and carpets. Dust Mites: Many people with asthma are allergic to dust mites. Dust mites are tiny bugs that are found in every home in mattresses, pillows, carpets, fabric-covered furniture, bedcovers, clothes, stuffed toys, and other fabric-covered items.   Cover your mattress in a special dust-proof cover.  Cover your pillow in a special dust-proof cover, or wash the  pillow each week in hot water. Water must be hotter than 130 F (54.4 C) to kill dust mites. Cold or warm water used with detergent and bleach can also be effective.  Wash the sheets and blankets on your bed each week in hot water.  Try not to sleep or lie on cloth-covered cushions.  Call ahead when traveling and ask for a smoke-free hotel room. Bring your own bedding and pillows in case the hotel only supplies feather pillows and down comforters, which may contain dust mites and cause asthma symptoms.  Remove carpets from your bedroom and those laid on concrete, if you can.  Keep stuffed toys out of the bed, or wash the toys weekly in hot water or cooler water with detergent and bleach. Cockroaches: Many people with asthma are allergic to the droppings and remains of cockroaches.   Keep food and garbage in closed containers. Never leave food out.  Use poison baits, traps, powders, gels, or paste (for example, boric acid).  If a spray is used to kill cockroaches, stay out of the room until the odor goes away. Indoor Mold:  Fix leaky faucets, pipes, or other  sources of water that have mold around them.  Clean floors and moldy surfaces with a fungicide or diluted bleach.  Avoid using humidifiers, vaporizers, or swamp coolers. These can spread molds through the air. Pollen and Outdoor Mold:  When pollen or mold spore counts are high, try to keep your windows closed.  Stay indoors with windows closed from late morning to afternoon. Pollen and some mold spore counts are highest at that time.  Ask your health care provider whether you need to take anti-inflammatory medicine or increase your dose of the medicine before your allergy season starts. Other Irritants to Avoid:  Tobacco smoke is an irritant. If you smoke, ask your health care provider how you can quit. Ask family members to quit smoking, too. Do not allow smoking in your home or car.  If possible, do not use a wood-burning stove, kerosene heater, or fireplace. Minimize exposure to all sources of smoke, including incense, candles, fires, and fireworks.  Try to stay away from strong odors and sprays, such as perfume, talcum powder, hair spray, and paints.  Decrease humidity in your home and use an indoor air cleaning device. Reduce indoor humidity to below 60%. Dehumidifiers or central air conditioners can do this.  Decrease house dust exposure by changing furnace and air cooler filters frequently.  Try to have someone else vacuum for you once or twice a week. Stay out of rooms while they are being vacuumed and for a short while afterward.  If you vacuum, use a dust mask from a hardware store, a double-layered or microfilter vacuum cleaner bag, or a vacuum cleaner with a HEPA filter.  Sulfites in foods and beverages can be irritants. Do not drink beer or wine or eat dried fruit, processed potatoes, or shrimp if they cause asthma symptoms.  Cold air can trigger an asthma attack. Cover your nose and mouth with a scarf on cold or windy days.  Several health conditions can make asthma more  difficult to manage, including a runny nose, sinus infections, reflux disease, psychological stress, and sleep apnea. Work with your health care provider to manage these conditions.  Avoid close contact with people who have a respiratory infection such as a cold or the flu, since your asthma symptoms may get worse if you catch the infection. Wash your hands thoroughly after touching  items that may have been handled by people with a respiratory infection.  Get a flu shot every year to protect against the flu virus, which often makes asthma worse for days or weeks. Also get a pneumonia shot if you have not previously had one. Unlike the flu shot, the pneumonia shot does not need to be given yearly. Medicines:  Talk to your health care provider about whether it is safe for you to take aspirin or non-steroidal anti-inflammatory medicines (NSAIDs). In a small number of people with asthma, aspirin and NSAIDs can cause asthma attacks. These medicines must be avoided by people who have known aspirin-sensitive asthma. It is important that people with aspirin-sensitive asthma read labels of all over-the-counter medicines used to treat pain, colds, coughs, and fever.  Beta-blockers and ACE inhibitors are other medicines you should discuss with your health care provider. HOW CAN I FIND OUT WHAT I AM ALLERGIC TO? Ask your asthma health care provider about allergy skin testing or blood testing (the RAST test) to identify the allergens to which you are sensitive. If you are found to have allergies, the most important thing to do is to try to avoid exposure to any allergens that you are sensitive to as much as possible. Other treatments for allergies, such as medicines and allergy shots (immunotherapy) are available.  CAN I EXERCISE? Follow your health care provider's advice regarding asthma treatment before exercising. It is important to maintain a regular exercise program, but vigorous exercise or exercise in cold,  humid, or dry environments can cause asthma attacks, especially for those people who have exercise-induced asthma. Document Released: 05/07/2009 Document Revised: 05/24/2013 Document Reviewed: 11/24/2012 [facility] Patient Information 2015 [place], [place]. This information is not intended to replace advice given to you by your health care provider. Make sure you discuss any questions you have with your health care provider.

## 2015-01-25 LAB — CULTURE, GROUP A STREP: Strep A Culture: NEGATIVE

## 2015-02-03 ENCOUNTER — Encounter (HOSPITAL_COMMUNITY): Payer: Self-pay | Admitting: *Deleted

## 2015-02-03 ENCOUNTER — Emergency Department (HOSPITAL_COMMUNITY)
Admission: EM | Admit: 2015-02-03 | Discharge: 2015-02-03 | Disposition: A | Discharge status: Home / Self Care | Payer: Self-pay | Attending: Emergency Medicine | Admitting: Emergency Medicine

## 2015-02-03 DIAGNOSIS — J45901 Unspecified asthma with (acute) exacerbation: Secondary | ICD-10-CM | POA: Insufficient documentation

## 2015-02-03 DIAGNOSIS — Z720 Tobacco use: Secondary | ICD-10-CM | POA: Insufficient documentation

## 2015-02-03 DIAGNOSIS — G40909 Epilepsy, unspecified, not intractable, without status epilepticus: Secondary | ICD-10-CM | POA: Insufficient documentation

## 2015-02-03 DIAGNOSIS — Z79899 Other long term (current) drug therapy: Secondary | ICD-10-CM | POA: Insufficient documentation

## 2015-02-03 MED ORDER — PREDNISONE 20 MG PO TABS
60.0000 mg | ORAL_TABLET | Freq: Once | ORAL | Status: AC
  Administered 2015-02-03: 60 mg via ORAL
  Filled 2015-02-03: qty 3

## 2015-02-03 MED ORDER — IPRATROPIUM BROMIDE 0.02 % IN SOLN
0.5000 mg | Freq: Once | RESPIRATORY_TRACT | Status: AC
  Administered 2015-02-03: 0.5 mg via RESPIRATORY_TRACT
  Filled 2015-02-03: qty 2.5

## 2015-02-03 MED ORDER — PREDNISONE 50 MG PO TABS: 50.0000 mg | ORAL_TABLET | Freq: Every day | ORAL | Status: DC

## 2015-02-03 MED ORDER — ALBUTEROL SULFATE (2.5 MG/3ML) 0.083% IN NEBU
5.0000 mg | INHALATION_SOLUTION | Freq: Once | RESPIRATORY_TRACT | Status: AC
  Administered 2015-02-03: 5 mg via RESPIRATORY_TRACT
  Filled 2015-02-03: qty 6

## 2015-02-03 NOTE — ED Provider Notes (Signed)
CSN: [id number]     Arrival date & time 02/03/15  0410 History   First MD Initiated Contact with Patient 02/03/15 0431     Chief Complaint  Patient presents with  . Shortness of Breath     (Consider location/radiation/quality/duration/timing/severity/associated sxs/prior Treatment) HPI  Pt with hx of asthma and seizure disorder presents with c/o shortness of breath and wheezing.  He states he has been using albuterol inhaler which has not been helping.  He was seen in the ED on 8/22 and given rx for prednisone- he states he was only able to afford to buy one day of prednisone.  He does smoke cigarettes. No chest pain. No leg swelling. He has continued to have cough.  There are no other associated systemic symptoms, there are no other alleviating or modifying factors.   Past Medical History  Diagnosis Date  . Asthma   . Seizure    History reviewed. No pertinent past surgical history. Family History  Problem Relation Age of Onset  . Cancer Other    Social History  Substance Use Topics  . Smoking status: Current Every Day Smoker -- 0.75 packs/day    Types: Cigarettes  . Smokeless tobacco: None  . Alcohol Use: No     Comment: denies 08/05/2014    Review of Systems  ROS reviewed and all otherwise negative except for mentioned in HPI    Allergies  Review of patient's allergies indicates no known allergies.  Home Medications   Prior to Admission medications   Medication Sig Start Date End Date Taking? Authorizing Provider  albuterol (PROVENTIL HFA;VENTOLIN HFA) 108 (90 BASE) MCG/ACT inhaler Inhale 1-2 puffs into the lungs every 6 (six) hours as needed for wheezing or shortness of breath. 01/17/15  Yes Benjamin Cartner, PA-C  ibuprofen (ADVIL,MOTRIN) 200 MG tablet Take 400 mg by mouth every 6 (six) hours as needed for headache (headache).    Yes Historical Provider, MD  phenytoin (DILANTIN) 100 MG ER capsule Take 3 capsules (300 mg total) by mouth 3 (three) times daily. 10/20/14  Yes  Tomasita Crumble, MD  predniSONE (DELTASONE) 50 MG tablet Take 1 tablet (50 mg total) by mouth daily. 02/03/15   Jerelyn Scott, MD   BP 112/67 mmHg  Pulse 117  Temp(Src) 98.4 F (36.9 C) (Oral)  Resp 16  Ht 5\' 9"  (1.753 m)  Wt 130 lb (58.968 kg)  BMI 19.19 kg/m2  SpO2 92%  Vitals reviewed Physical Exam  Physical Examination: General appearance - alert, well appearing, and in no distress Mental status - alert, oriented to person, place, and time Eyes - no conjunctival injection, no scleral icterus Mouth - mucous membranes moist, pharynx normal without lesions Chest - clear to auscultation, mild bilateral wheezes, rales or rhonchi, symmetric air entry, normal respiratory effort Heart - normal rate, regular rhythm, normal S1, S2, no murmurs, rubs, clicks or gallops Abdomen - soft, nontender, nondistended, no masses or organomegaly Neurological - alert, oriented Extremities - peripheral pulses normal, no pedal edema, no clubbing or cyanosis Skin - normal coloration and turgor, no rashes  ED Course  Procedures (including critical care time) Labs Review Labs Reviewed - No data to display  Imaging Review No results found. I have personally reviewed and evaluated these images and lab results as part of my medical decision-making.   EKG Interpretation None      MDM   Final diagnoses:  Asthma exacerbation    Pt presenting with c/o wheezing and cough.  He was treated recently for  asthma exacerbation but did not have money to get prednisone rx- he took one day of this.  Wheezing has been increasing since that time.  Pt feels improved after 2 nebs in the ED.  Given dose of prednisone- he states he has the rx at the pharmacy still and verbalized understanding of the importance of scheduled albuterol in addition to completing 5 days of prednisone.  Discharged with strict return precautions.  Pt agreeable with plan.    Jerelyn Scott, MD 02/04/15 [phone]

## 2015-02-03 NOTE — ED Notes (Signed)
MD at bedside. 

## 2015-02-03 NOTE — ED Notes (Signed)
The pt has asthma and  For the past 2-3 hours he has had more difficulty breathing. He was here a week ago for the same and he was given an albuterol inhaler which is not helping.  Audible wheezes and cough.  He is still a smoker and he thinks he has caught a cold

## 2015-02-03 NOTE — Discharge Instructions (Signed)
Return to the ED with any concerns including difficulty breathing despite using albuterol every 4 hours, not drinking fluids, decreased urine output, vomiting and not able to keep down liquids or medications, decreased level of alertness/lethargy, or any other alarming symptoms °

## 2015-05-31 IMAGING — CT CT HEAD W/O CM
2 series · 17 of 30 positions shown, 20 images · non-contrast
Comparison: 07/09/2012

CLINICAL DATA: Seizure today. Hit back of head. Now with headache.
History of epilepsy.

EXAM:
CT HEAD WITHOUT CONTRAST
TECHNIQUE: Contiguous axial images were obtained from the base of the skull
through the vertex without intravenous contrast.

[Series 2: head w/o · axial · non-contrast · 0.42mm/px · z∈[+1287,+1407]mm · 9 of 31 slices shown, 12 images]
[im 4/31  brain]
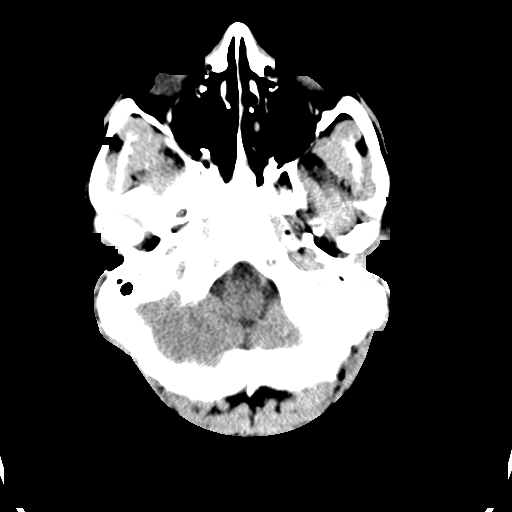
[im 4/31  bone]
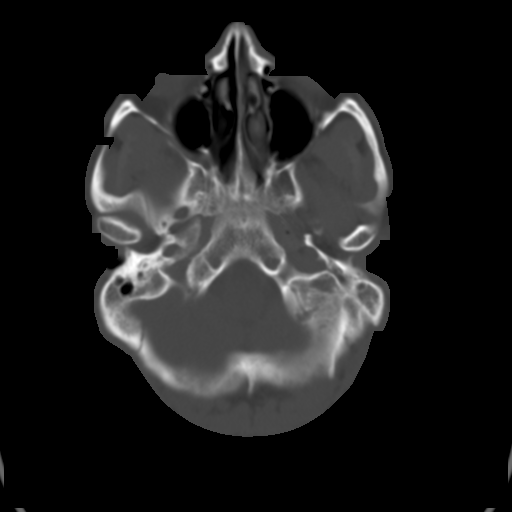
[im 7/31  brain]
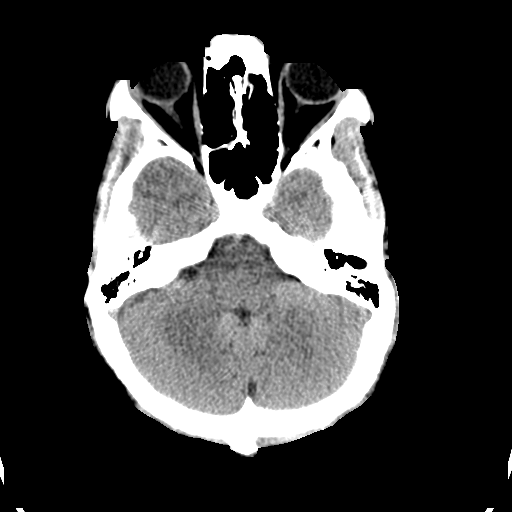
[im 10/31  brain]
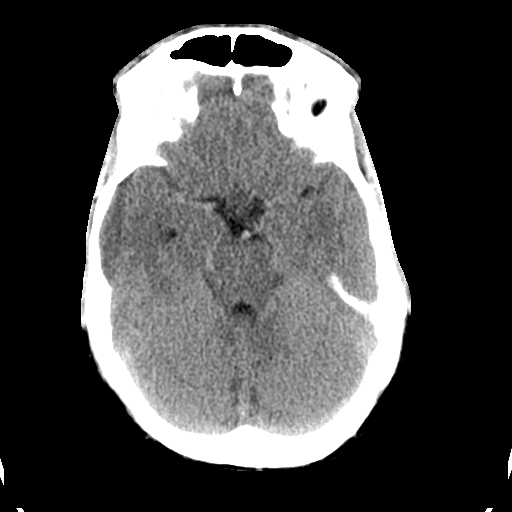
[im 13/31  brain]
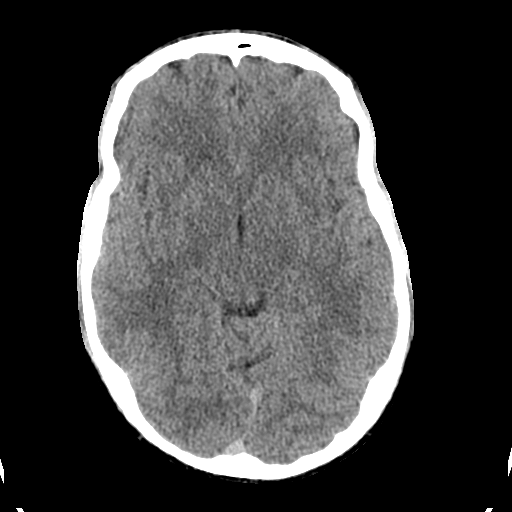
[im 16/31  brain]
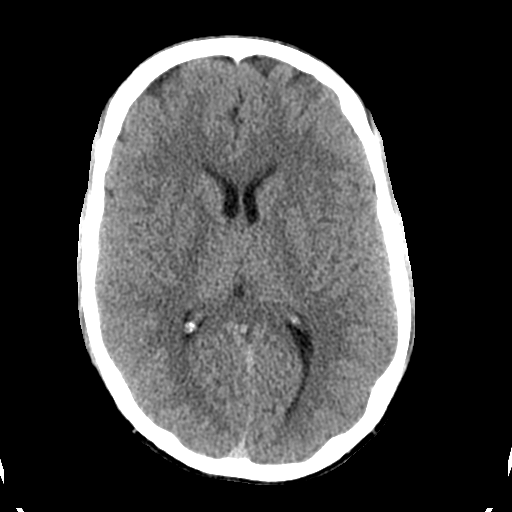
[im 16/31  bone]
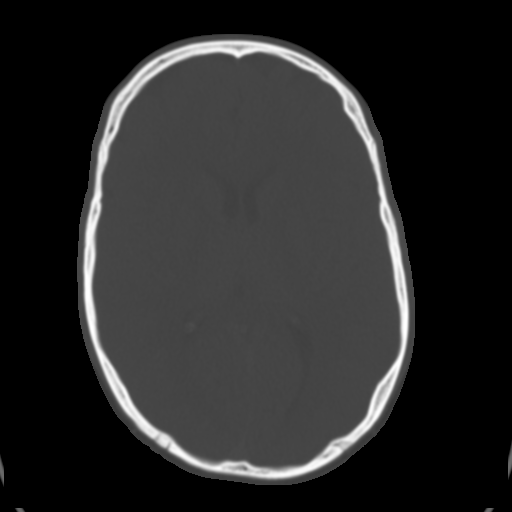
[im 19/31  brain]
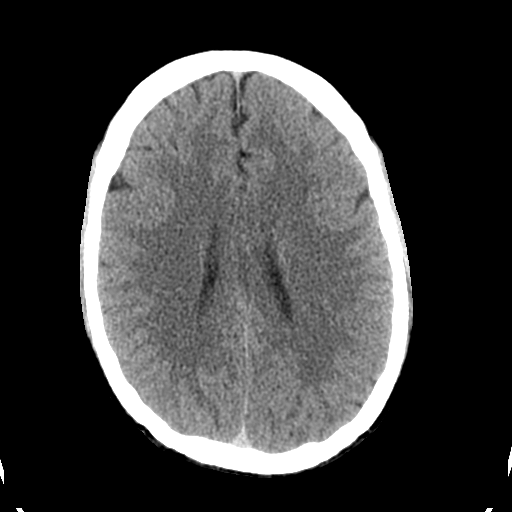
[im 22/31  brain]
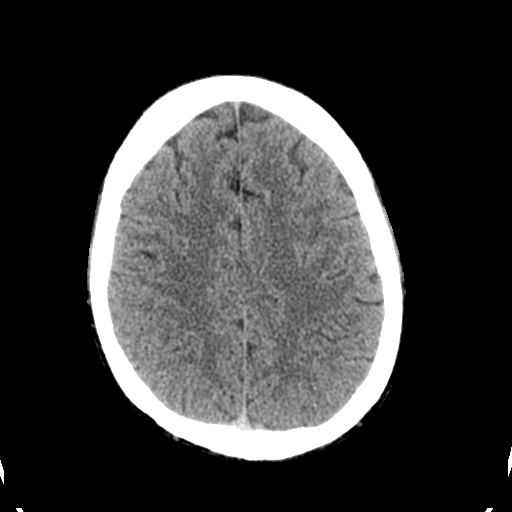
[im 25/31  brain]
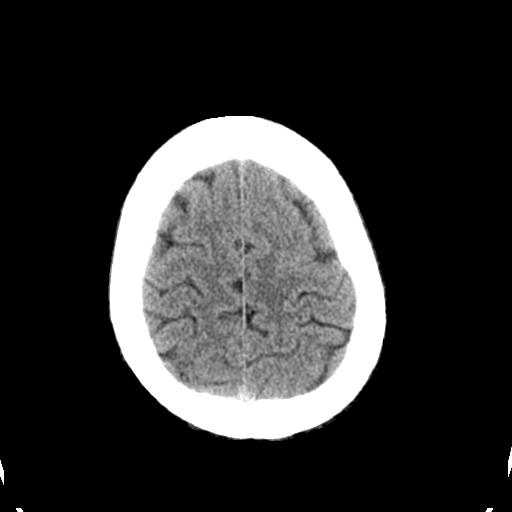
[im 28/31  brain]
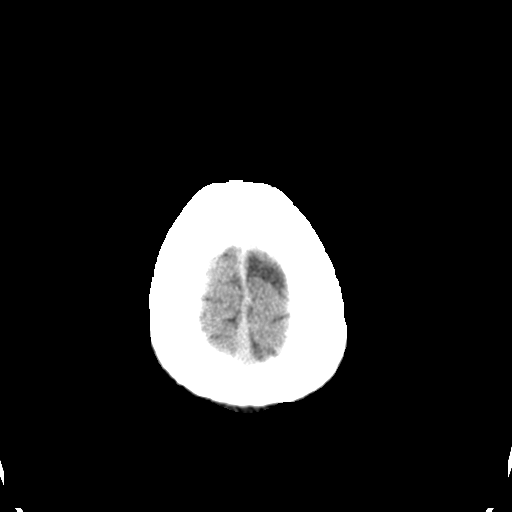
[im 28/31  bone]
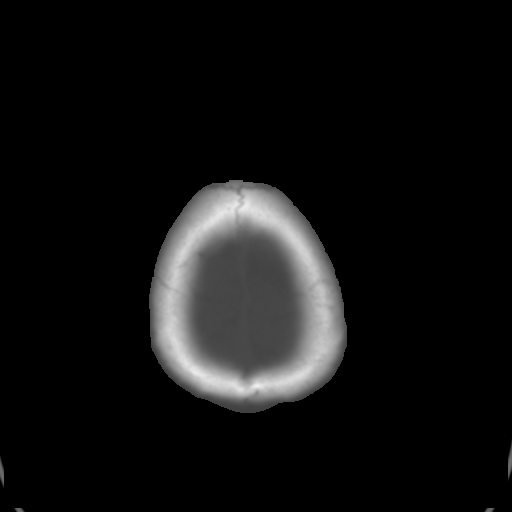

[Series 3: bone windows · axial · 0.42mm/px · z∈[+1287,+1404]mm · 8 of 51 slices shown]
[im 6/51  bone]
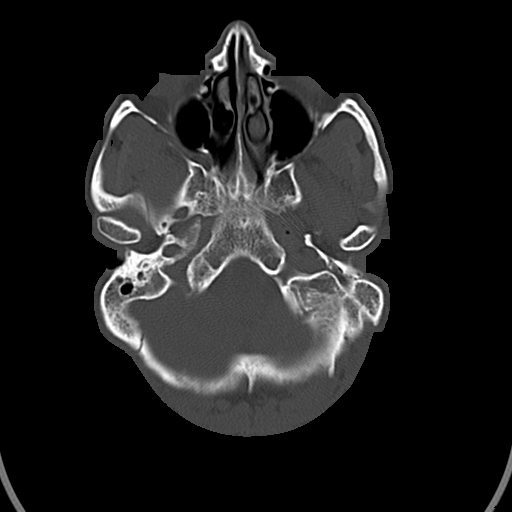
[im 12/51  bone]
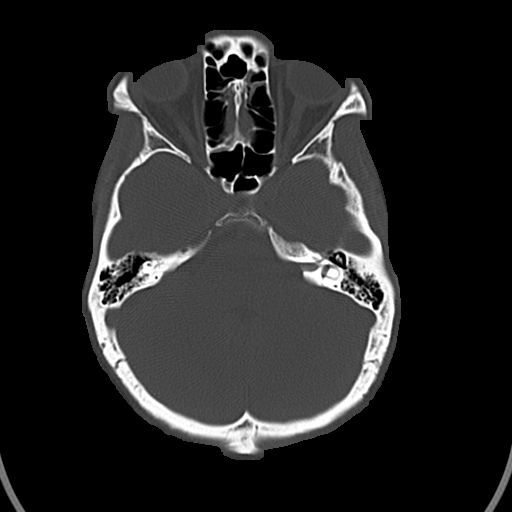
[im 17/51  bone]
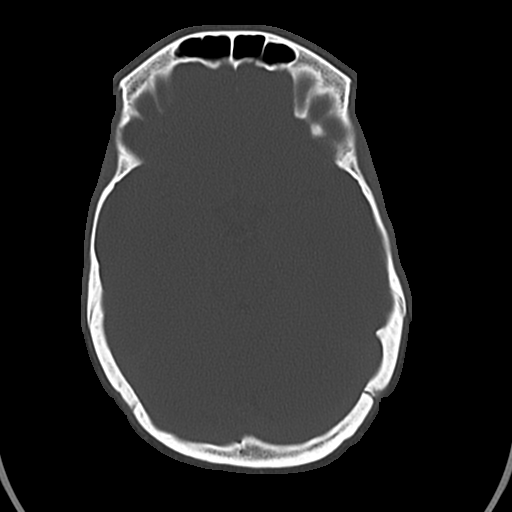
[im 23/51  bone]
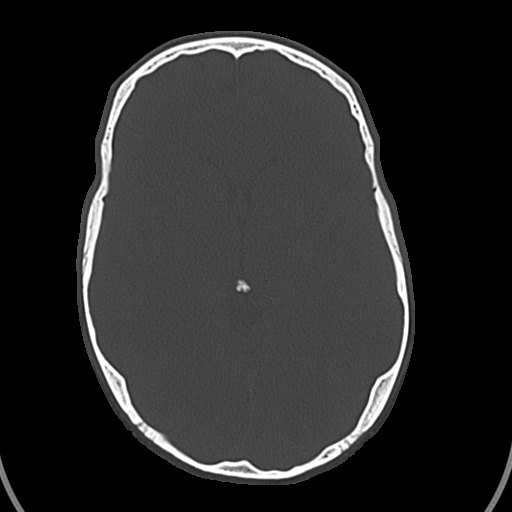
[im 28/51  bone]
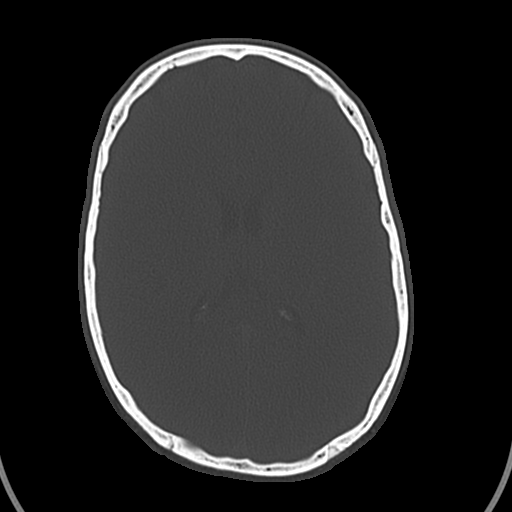
[im 34/51  bone]
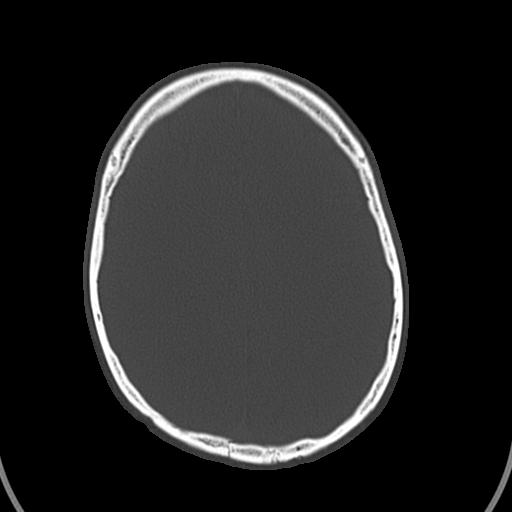
[im 39/51  bone]
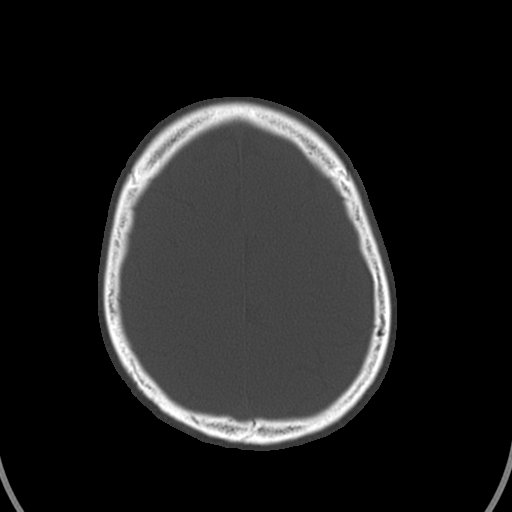
[im 45/51  bone]
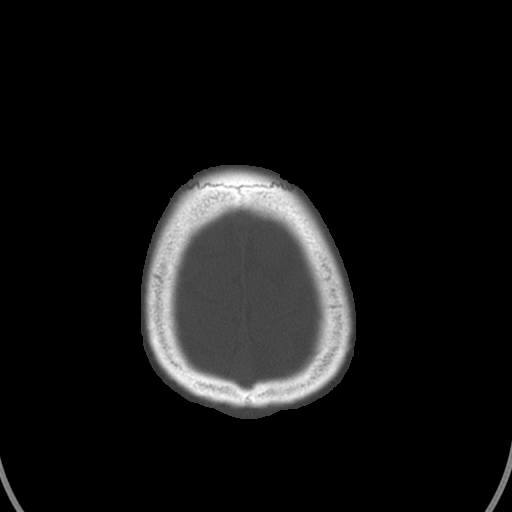

[17 of 30 positions shown; findings below may reference images not displayed]

FINDINGS: Ventricles are normal in size and configuration. There are no
parenchymal masses or mass effect. There is no evidence of an
infarct. There are no areas of abnormal parenchymal attenuation.

There are no extra-axial masses or abnormal fluid collections.

There is no intracranial hemorrhage.

No skull fracture. Visualized sinuses and mastoid air cells are
clear.
IMPRESSION: Normal unenhanced CT scan of the brain.

## 2015-07-06 IMAGING — CR DG CHEST 2V
3 series · 3 of 3 positions shown · non-contrast
Comparison: 08/05/2014

CLINICAL DATA: Seizure.  History of asthma

EXAM:
CHEST  2 VIEW

[w chest lat]
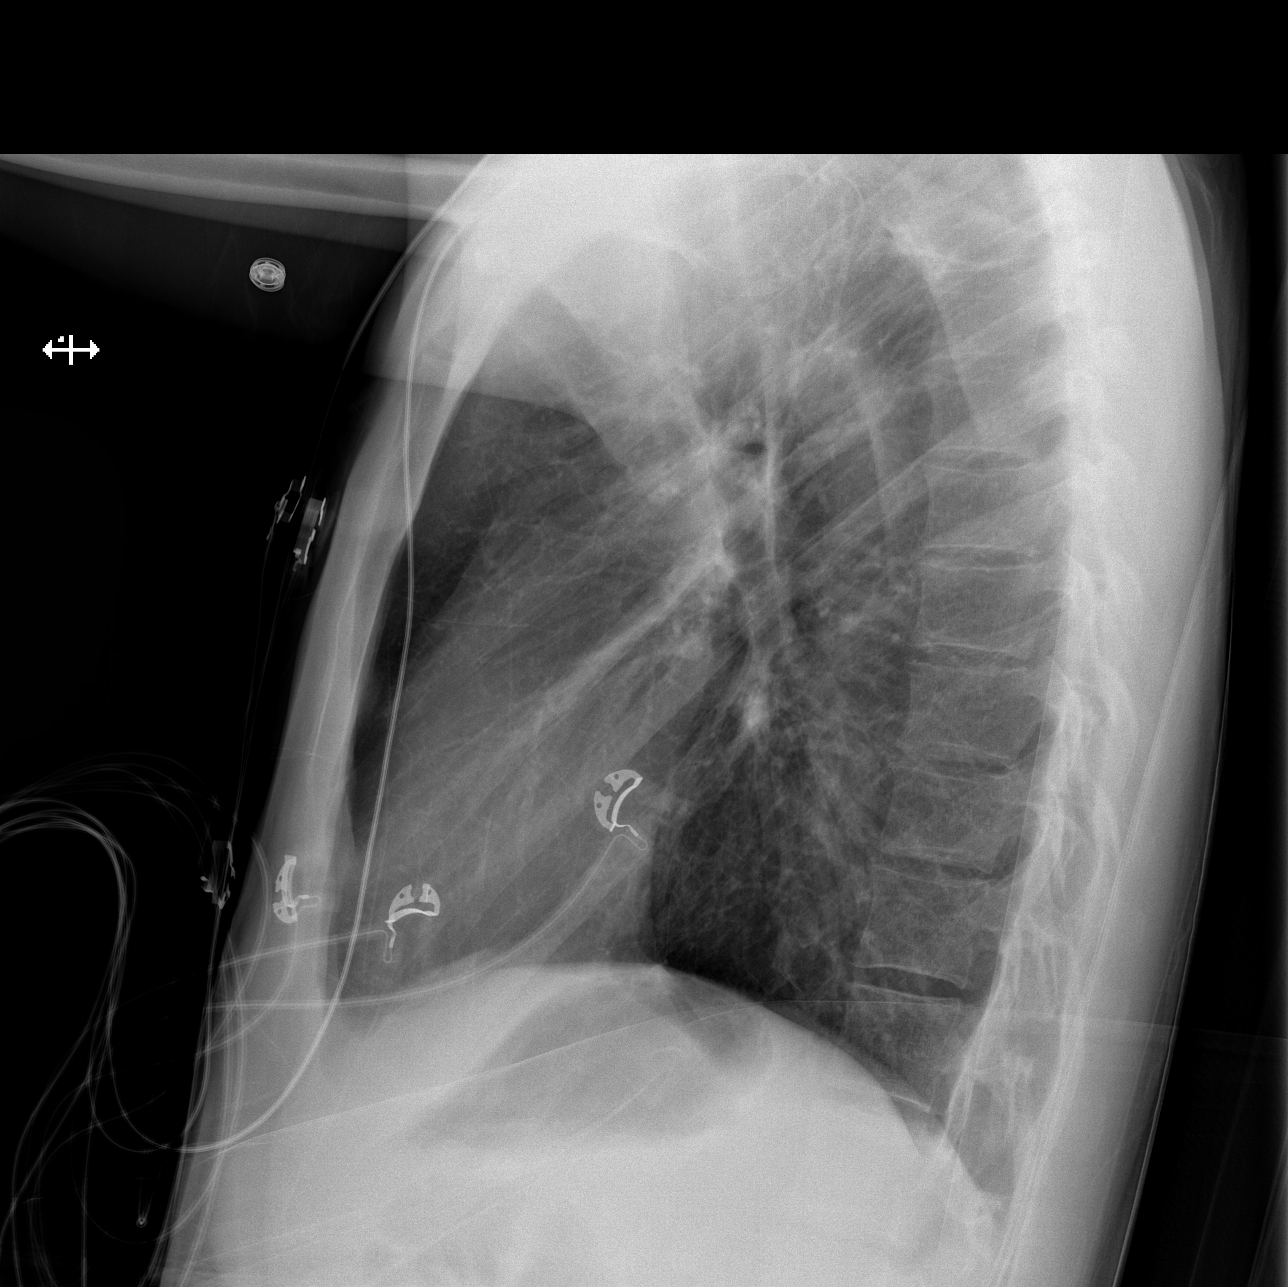

[x chest ap (1 of 2)]
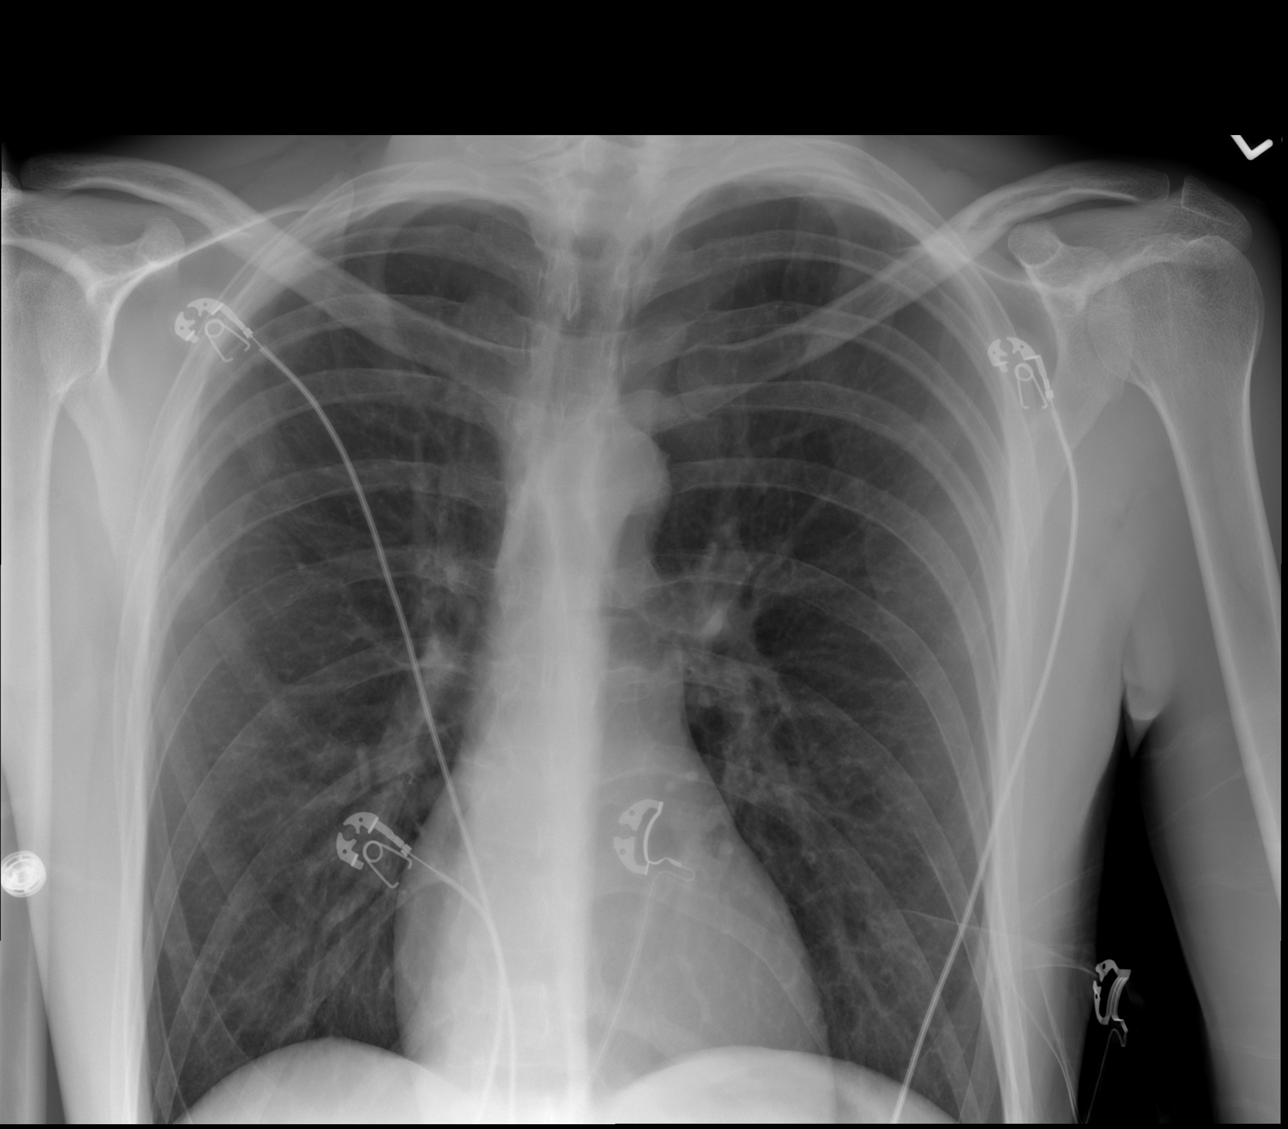

[x chest ap (2 of 2)]
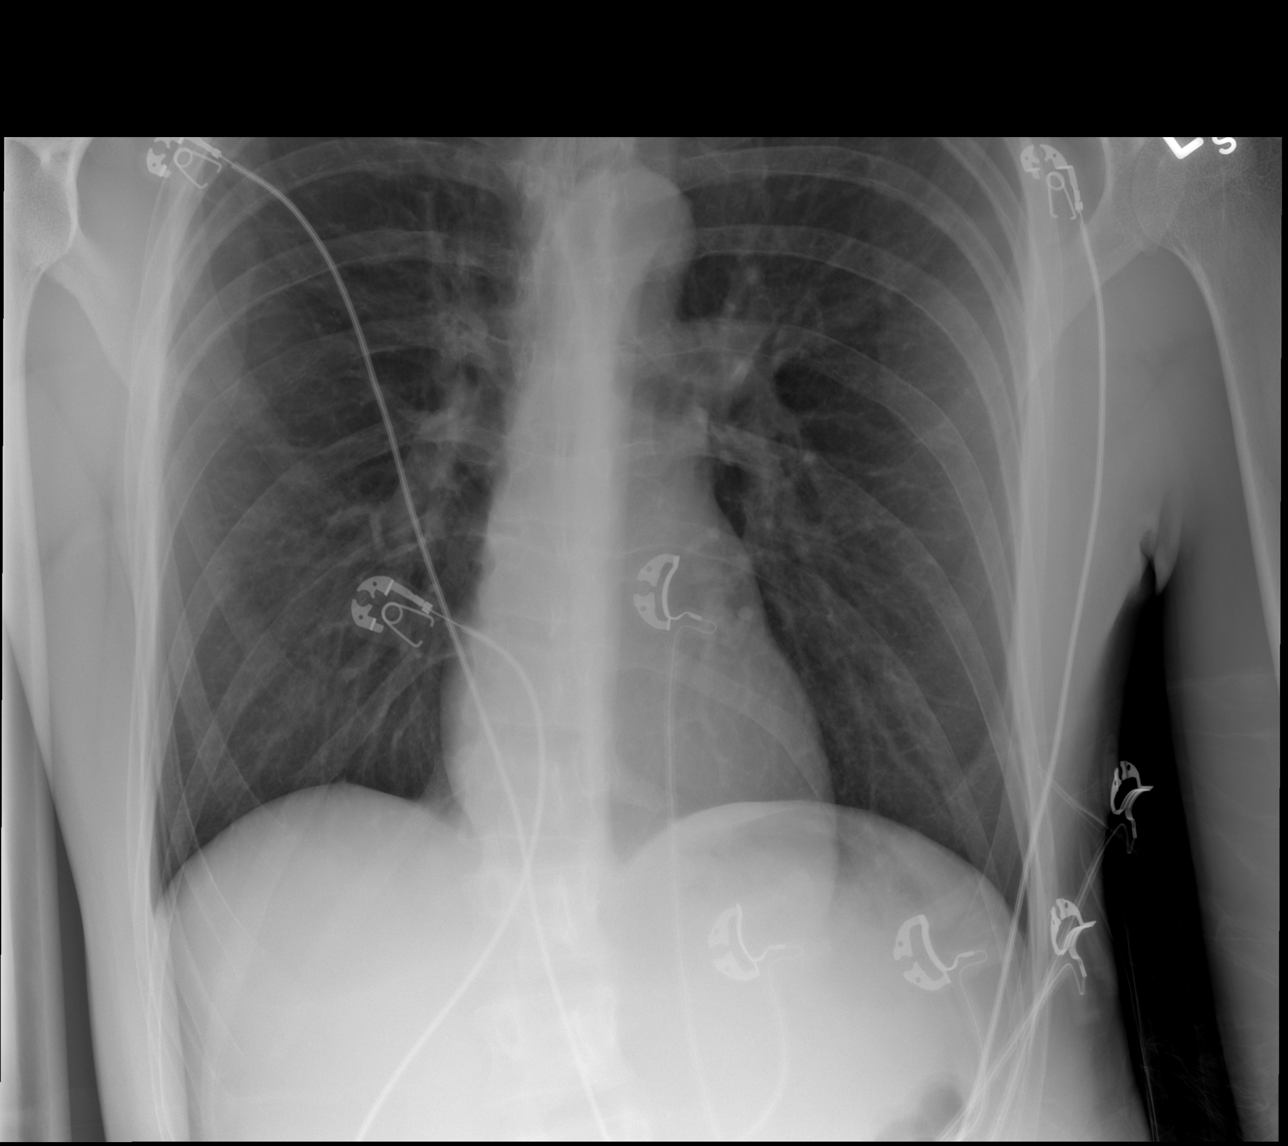

[3 of 3 positions shown; findings below may reference images not displayed]

FINDINGS: Normal heart size and mediastinal contours. No acute infiltrate or
edema. No effusion or pneumothorax. No acute osseous findings.
IMPRESSION: Negative chest.

## 2016-01-31 ENCOUNTER — Emergency Department (HOSPITAL_COMMUNITY)
Admission: EM | Admit: 2016-01-31 | Discharge: 2016-01-31 | Disposition: A | Discharge status: Home / Self Care | Payer: Self-pay | Attending: Emergency Medicine | Admitting: Emergency Medicine

## 2016-01-31 ENCOUNTER — Encounter (HOSPITAL_COMMUNITY): Payer: Self-pay | Admitting: Emergency Medicine

## 2016-01-31 DIAGNOSIS — Z791 Long term (current) use of non-steroidal anti-inflammatories (NSAID): Secondary | ICD-10-CM | POA: Insufficient documentation

## 2016-01-31 DIAGNOSIS — F17210 Nicotine dependence, cigarettes, uncomplicated: Secondary | ICD-10-CM | POA: Insufficient documentation

## 2016-01-31 DIAGNOSIS — J45909 Unspecified asthma, uncomplicated: Secondary | ICD-10-CM | POA: Insufficient documentation

## 2016-01-31 DIAGNOSIS — J029 Acute pharyngitis, unspecified: Secondary | ICD-10-CM | POA: Insufficient documentation

## 2016-01-31 DIAGNOSIS — Z79899 Other long term (current) drug therapy: Secondary | ICD-10-CM | POA: Insufficient documentation

## 2016-01-31 LAB — RAPID STREP SCREEN (MED CTR MEBANE ONLY): Streptococcus, Group A Screen (Direct): NEGATIVE

## 2016-01-31 MED ORDER — ALBUTEROL SULFATE (2.5 MG/3ML) 0.083% IN NEBU
5.0000 mg | INHALATION_SOLUTION | Freq: Once | RESPIRATORY_TRACT | Status: AC
  Administered 2016-01-31: 5 mg via RESPIRATORY_TRACT
  Filled 2016-01-31: qty 6

## 2016-01-31 MED ORDER — AMOXICILLIN 875 MG PO TABS: 875.0000 mg | ORAL_TABLET | Freq: Two times a day (BID) | ORAL | 0 refills | Status: DC

## 2016-01-31 MED ORDER — ACETAMINOPHEN-CODEINE 120-12 MG/5ML PO SOLN: 10.0000 mL | ORAL | 0 refills | Status: DC | PRN

## 2016-01-31 MED ORDER — IBUPROFEN 800 MG PO TABS
800.0000 mg | ORAL_TABLET | Freq: Once | ORAL | Status: AC
  Administered 2016-01-31: 800 mg via ORAL
  Filled 2016-01-31: qty 1

## 2016-01-31 MED ORDER — IPRATROPIUM BROMIDE 0.02 % IN SOLN
0.5000 mg | RESPIRATORY_TRACT | Status: AC
  Administered 2016-01-31: 0.5 mg via RESPIRATORY_TRACT
  Filled 2016-01-31: qty 2.5

## 2016-01-31 NOTE — ED Provider Notes (Signed)
[facility]-EMERGENCY DEPT Provider Note   CSN: [id number] Arrival date & time: 01/31/16  1240  By signing my name below, I, Aggie MoatsJenny Song, attest that this documentation has been prepared under the direction and in the presence of BoeingChris Janice Seales, PA-C. Electronically signed by: Aggie MoatsJenny Song, ED Scribe. 01/31/16. 2:09 PM.  History   Chief Complaint Chief Complaint  Patient presents with  . Sore Throat   The history is provided by the patient. No language interpreter was used.   HPI Comments:  Suzan GaribaldiBrandon D Kwasnik is a 30 y.o. male with a PMHx of asthma and seizures who presents to the Emergency Department complaining of sore throat, which started  2 days ago. Associated symptoms include SOB, headache, nasal congestion, dry cough and chills. He reports that he took 4-6 puffs of his rescue inhaler and Ibuprofen, with little relief. Denies fever.   Pt reports that he should be taking Dilantin to manage seizures, but hasn't been compliant. Last seizure was 2 months ago.  Past Medical History:  Diagnosis Date  . Asthma   . Seizure (HCC)     There are no active problems to display for this patient.   History reviewed. No pertinent surgical history.     Home Medications    Prior to Admission medications   Medication Sig Start Date End Date Taking? Authorizing Provider  albuterol (PROVENTIL HFA;VENTOLIN HFA) 108 (90 BASE) MCG/ACT inhaler Inhale 1-2 puffs into the lungs every 6 (six) hours as needed for wheezing or shortness of breath. 01/17/15   Joycie PeekBenjamin Cartner, PA-C  ibuprofen (ADVIL,MOTRIN) 200 MG tablet Take 400 mg by mouth every 6 (six) hours as needed for headache (headache).     Historical Provider, MD  phenytoin (DILANTIN) 100 MG ER capsule Take 3 capsules (300 mg total) by mouth 3 (three) times daily. 10/20/14   Tomasita CrumbleAdeleke Oni, MD  predniSONE (DELTASONE) 50 MG tablet Take 1 tablet (50 mg total) by mouth daily. 02/03/15   Jerelyn ScottMartha Linker, MD    Family History Family History  Problem Relation Age  of Onset  . Cancer Other     Social History Social History  Substance Use Topics  . Smoking status: Current Every Day Smoker    Packs/day: 0.75    Types: Cigarettes  . Smokeless tobacco: Not on file  . Alcohol use Yes     Comment: occasional     Allergies   Review of patient's allergies indicates no known allergies.   Review of Systems Review of Systems  Constitutional: Positive for chills. Negative for fever.  HENT: Positive for congestion and sore throat.   Respiratory: Positive for cough.   Neurological: Positive for headaches.  All other systems reviewed and are negative.    Physical Exam Updated Vital Signs BP 136/79 (BP Location: Right Arm)   Pulse 95   Temp 99.7 F (37.6 C) (Oral)   Resp 18   Ht 5\' 9"  (1.753 m)   Wt 131 lb (59.4 kg)   SpO2 100%   BMI 19.35 kg/m   Physical Exam  Constitutional: He is oriented to person, place, and time. He appears well-developed and well-nourished.  HENT:  Head: Normocephalic and atraumatic.  Mouth/Throat: Oropharyngeal exudate ( left tonsil) present.  Throat erythematous and edematous.  Eyes: Pupils are equal, round, and reactive to light.  Neck: Normal range of motion.  Cardiovascular: Normal rate, regular rhythm and normal heart sounds.   Pulmonary/Chest: Effort normal and breath sounds normal.  Abdominal: Soft. Bowel sounds are normal.  Musculoskeletal: Normal  range of motion.  Neurological: He is alert and oriented to person, place, and time.  Skin: Skin is warm and dry.  Psychiatric: He has a normal mood and affect.  Nursing note and vitals reviewed.   ED Treatments / Results  DIAGNOSTIC STUDIES:  Oxygen Saturation is 100% on room air, normal by my interpretation.    COORDINATION OF CARE:  2:08 PM Discussed treatment plan with pt at bedside, which includes rapid strep screen, and pt agreed to plan.  Labs (all labs ordered are listed, but only abnormal results are displayed) Labs Reviewed - No data to  display  EKG  EKG Interpretation None       Radiology No results found.  Procedures Procedures (including critical care time)  Medications Ordered in ED Medications  albuterol (PROVENTIL) (2.5 MG/3ML) 0.083% nebulizer solution 5 mg (5 mg Nebulization Given 01/31/16 1329)  ipratropium (ATROVENT) nebulizer solution 0.5 mg (0.5 mg Nebulization Given 01/31/16 1329)     Initial Impression / Assessment and Plan / ED Course  I have reviewed the triage vital signs and the nursing notes.  Pertinent labs & imaging results that were available during my care of the patient were reviewed by me and considered in my medical decision making (see chart for details).  Clinical Course      Final Clinical Impressions(s) / ED Diagnoses   Final diagnoses:  None    New Prescriptions New Prescriptions   No medications on file  I personally performed the services described in this documentation, which was scribed in my presence. The recorded information has been reviewed and is accurate.     Charlestine Night, PA-C 02/04/16 1601    Derwood Kaplan, MD 02/05/16 1610

## 2016-01-31 NOTE — Progress Notes (Addendum)
Pt has tight BS throughout all lung fields. He does not appear in respiratory distress. Pt was given a breathing treatment. Pt state she has body aches times two days and just does not feel good. Pt is s/p a breathing treatment. He stated his breathing is better but he continues to have body aches. Lung sounds are clear in all lobes. Pt c/o a headache and body aches. Pt was medicated with 800mg  of motrin. (2:05pm)

## 2016-01-31 NOTE — Discharge Instructions (Signed)
Return here as needed.  Follow-up with your primary doctor. °

## 2016-01-31 NOTE — ED Triage Notes (Signed)
Patient complaining of sore throat, chills and body aches x2 days. Also reports cough, sneezing and wheezing x2 days. Pt reports hx of asthma. Bilateral wheezing noted. No increased work of breathing noted. NAD noted. Pt reports taking ibuprofen and inhaler around 3am this morning with little relief.

## 2016-02-02 LAB — CULTURE, GROUP A STREP (THRC)

## 2016-03-18 ENCOUNTER — Emergency Department (HOSPITAL_COMMUNITY): Payer: Self-pay

## 2016-03-18 ENCOUNTER — Encounter (HOSPITAL_COMMUNITY): Payer: Self-pay | Admitting: Family Medicine

## 2016-03-18 ENCOUNTER — Emergency Department (HOSPITAL_COMMUNITY)
Admission: EM | Admit: 2016-03-18 | Discharge: 2016-03-19 | Disposition: A | Discharge status: Home / Self Care | Payer: Self-pay | Attending: Emergency Medicine | Admitting: Emergency Medicine

## 2016-03-18 DIAGNOSIS — J4521 Mild intermittent asthma with (acute) exacerbation: Secondary | ICD-10-CM | POA: Insufficient documentation

## 2016-03-18 DIAGNOSIS — F17210 Nicotine dependence, cigarettes, uncomplicated: Secondary | ICD-10-CM | POA: Insufficient documentation

## 2016-03-18 DIAGNOSIS — Z7951 Long term (current) use of inhaled steroids: Secondary | ICD-10-CM | POA: Insufficient documentation

## 2016-03-18 DIAGNOSIS — Z79899 Other long term (current) drug therapy: Secondary | ICD-10-CM | POA: Insufficient documentation

## 2016-03-18 MED ORDER — ALBUTEROL SULFATE (2.5 MG/3ML) 0.083% IN NEBU
5.0000 mg | INHALATION_SOLUTION | Freq: Once | RESPIRATORY_TRACT | Status: AC
  Administered 2016-03-18: 5 mg via RESPIRATORY_TRACT
  Filled 2016-03-18: qty 6

## 2016-03-18 NOTE — ED Triage Notes (Signed)
Patient reports he started experiencing an asthma attack this morning when he woke up. Reports he has used all of his emergency inhaler today. Last used: ProAir one hour ago.

## 2016-03-19 MED ORDER — PREDNISONE 20 MG PO TABS
60.0000 mg | ORAL_TABLET | Freq: Once | ORAL | Status: AC
  Administered 2016-03-19: 60 mg via ORAL
  Filled 2016-03-19: qty 3

## 2016-03-19 MED ORDER — ALBUTEROL SULFATE HFA 108 (90 BASE) MCG/ACT IN AERS
2.0000 | INHALATION_SPRAY | RESPIRATORY_TRACT | Status: DC | PRN
  Administered 2016-03-19: 2 via RESPIRATORY_TRACT
  Filled 2016-03-19: qty 6.7

## 2016-03-19 MED ORDER — PREDNISONE 20 MG PO TABS: 60.0000 mg | ORAL_TABLET | Freq: Every day | ORAL | 0 refills | Status: AC

## 2016-03-19 MED ORDER — IPRATROPIUM BROMIDE 0.02 % IN SOLN
0.5000 mg | Freq: Once | RESPIRATORY_TRACT | Status: AC
  Administered 2016-03-19: 0.5 mg via RESPIRATORY_TRACT
  Filled 2016-03-19: qty 2.5

## 2016-03-19 MED ORDER — ALBUTEROL SULFATE (2.5 MG/3ML) 0.083% IN NEBU
5.0000 mg | INHALATION_SOLUTION | Freq: Once | RESPIRATORY_TRACT | Status: AC
  Administered 2016-03-19: 5 mg via RESPIRATORY_TRACT
  Filled 2016-03-19: qty 6

## 2016-03-19 NOTE — ED Provider Notes (Signed)
[facility] Provider Note   CSN: [id number] Arrival date & time: 03/18/16  2135  By signing my name below, I, Bruce Galvan, attest that this documentation has been prepared under the direction and in the presence of Elpidio AnisShari Georgette Helmer, PA-C Electronically Signed: Soijett Galvan, ED Scribe. 03/19/16. 1:06 AM.   History   Chief Complaint Chief Complaint  Patient presents with  . Asthma    HPI Bruce Galvan is a 30 y.o. male with a PMHx of asthma, who presents to the Emergency Department complaining of exacerbation of asthma onset 11 AM this morning. Pt notes that his current symptoms are slightly similar to his past asthma exacerbations but he feels a sharp chest discomfort when he coughs. He states that he is having associated symptoms of cough and wheezing. He states that he has tried albuterol rescue inhaler with no relief for his symptoms. He denies fever, vomiting, abdominal pain, and any other symptoms.   The history is provided by the patient. No language interpreter was used.    Past Medical History:  Diagnosis Date  . Asthma   . Seizure (HCC)     There are no active problems to display for this patient.   History reviewed. No pertinent surgical history.     Home Medications    Prior to Admission medications   Medication Sig Start Date End Date Taking? Authorizing Provider  acetaminophen-codeine 120-12 MG/5ML solution Take 10 mLs by mouth every 4 (four) hours as needed for moderate pain. 01/31/16   Charlestine Nighthristopher Lawyer, PA-C  albuterol (PROVENTIL HFA;VENTOLIN HFA) 108 (90 BASE) MCG/ACT inhaler Inhale 1-2 puffs into the lungs every 6 (six) hours as needed for wheezing or shortness of breath. 01/17/15   Joycie PeekBenjamin Cartner, PA-C  amoxicillin (AMOXIL) 875 MG tablet Take 1 tablet (875 mg total) by mouth 2 (two) times daily. 01/31/16   Charlestine Nighthristopher Lawyer, PA-C  ibuprofen (ADVIL,MOTRIN) 200 MG tablet Take 400 mg by mouth every 6 (six) hours as needed for headache (headache).      Historical Provider, MD  phenytoin (DILANTIN) 100 MG ER capsule Take 3 capsules (300 mg total) by mouth 3 (three) times daily. 10/20/14   Tomasita CrumbleAdeleke Oni, MD  predniSONE (DELTASONE) 50 MG tablet Take 1 tablet (50 mg total) by mouth daily. 02/03/15   Jerelyn ScottMartha Linker, MD    Family History Family History  Problem Relation Age of Onset  . Cancer Other     Social History Social History  Substance Use Topics  . Smoking status: Current Every Day Smoker    Packs/day: 0.75    Types: Cigarettes  . Smokeless tobacco: Never Used  . Alcohol use Yes     Comment: Once a week.      Allergies   Review of patient's allergies indicates no known allergies.   Review of Systems Review of Systems  Constitutional: Negative for fever.  HENT: Negative for congestion.   Respiratory: Positive for cough and wheezing.   Gastrointestinal: Negative for abdominal pain and vomiting.  Musculoskeletal: Negative for myalgias.  Skin: Negative for rash.     Physical Exam Updated Vital Signs BP 147/99 (BP Location: Left Arm)   Pulse 100   Temp 97.8 F (36.6 C) (Oral)   Resp 20   Ht 5\' 9"  (1.753 m)   Wt 135 lb (61.2 kg)   SpO2 98%   BMI 19.94 kg/m   Physical Exam  Constitutional: He is oriented to person, place, and time. He appears well-developed and well-nourished. No distress.  HENT:  Head: Normocephalic and atraumatic.  Eyes: EOM are normal.  Neck: Neck supple.  Cardiovascular: Normal rate, regular rhythm and normal heart sounds.   No murmur heard. Pulmonary/Chest: Effort normal. No respiratory distress. He has wheezes. He has no rales.  Minimal expiratory wheezing to upper lobes bilaterally.   Abdominal: Soft. He exhibits no distension. There is no tenderness.  Musculoskeletal: Normal range of motion.  Neurological: He is alert and oriented to person, place, and time.  Skin: Skin is warm and dry.  Psychiatric: He has a normal mood and affect. His behavior is normal.  Nursing note and vitals  reviewed.    ED Treatments / Results  DIAGNOSTIC STUDIES: Oxygen Saturation is 98% on RA, nl by my interpretation.    COORDINATION OF CARE: 12:59 AM Discussed treatment plan with pt at bedside which includes breathing treatment, CXR, and pt agreed to plan.   Radiology Dg Chest 2 View  Result Date: 03/18/2016 CLINICAL DATA:  Acute onset of shortness of breath. Congestion and chest tightness. Initial encounter. EXAM: CHEST  2 VIEW COMPARISON:  Chest radiograph performed 01/22/2015 FINDINGS: The lungs are well-aerated. Peribronchial thickening is noted. There is no evidence of focal opacification, pleural effusion or pneumothorax. The heart is normal in size; the mediastinal contour is within normal limits. No acute osseous abnormalities are seen. IMPRESSION: Peribronchial thickening noted.  Lungs otherwise grossly clear. Electronically Signed   By: Roanna Raider M.D.   On: 03/18/2016 22:43    Procedures Procedures (including critical care time)  Medications Ordered in ED Medications  albuterol (PROVENTIL) (2.5 MG/3ML) 0.083% nebulizer solution 5 mg (5 mg Nebulization Given 03/18/16 2208)     Initial Impression / Assessment and Plan / ED Course  I have reviewed the triage vital signs and the nursing notes.  Pertinent imaging results that were available during my care of the patient were reviewed by me and considered in my medical decision making (see chart for details).  Clinical Course    Patient with a history of asthma with onset of wheezing this morning. No fever. CXR negative for acute pulmonary abnormality. Wheezing significantly improved with nebs x 2. He reports he is comfortable with discharge home and is felt stable.   Final Clinical Impressions(s) / ED Diagnoses   Final diagnoses:  None   1. Asthma exacerbation  New Prescriptions New Prescriptions   No medications on file   I personally performed the services described in this documentation, which was scribed  in my presence. The recorded information has been reviewed and is accurate.      Elpidio Anis, PA-C 03/19/16 0309    Gilda Crease, MD 03/20/16 [phone]

## 2016-03-23 DIAGNOSIS — Z5321 Procedure and treatment not carried out due to patient leaving prior to being seen by health care provider: Secondary | ICD-10-CM | POA: Insufficient documentation

## 2016-03-23 DIAGNOSIS — J45909 Unspecified asthma, uncomplicated: Secondary | ICD-10-CM | POA: Insufficient documentation

## 2016-03-23 DIAGNOSIS — F17210 Nicotine dependence, cigarettes, uncomplicated: Secondary | ICD-10-CM | POA: Insufficient documentation

## 2016-03-23 MED ORDER — ALBUTEROL SULFATE (2.5 MG/3ML) 0.083% IN NEBU
5.0000 mg | INHALATION_SOLUTION | Freq: Once | RESPIRATORY_TRACT | Status: AC
  Administered 2016-03-23: 5 mg via RESPIRATORY_TRACT
  Filled 2016-03-23: qty 6

## 2016-03-23 NOTE — ED Triage Notes (Signed)
Pt states that he has had asthma tonight and the inhaler that we gave him here, proventil, has not helped him lately. Alert and oriented. O2 95%.

## 2016-03-24 ENCOUNTER — Emergency Department (HOSPITAL_COMMUNITY)
Admission: EM | Admit: 2016-03-24 | Discharge: 2016-03-24 | Disposition: A | Discharge status: Home / Self Care | Payer: Self-pay | Attending: Emergency Medicine | Admitting: Emergency Medicine

## 2016-04-27 ENCOUNTER — Emergency Department (HOSPITAL_COMMUNITY)
Admission: EM | Admit: 2016-04-27 | Discharge: 2016-04-27 | Disposition: A | Discharge status: Home / Self Care | Payer: Self-pay | Attending: Emergency Medicine | Admitting: Emergency Medicine

## 2016-04-27 ENCOUNTER — Encounter (HOSPITAL_COMMUNITY): Payer: Self-pay

## 2016-04-27 DIAGNOSIS — F17210 Nicotine dependence, cigarettes, uncomplicated: Secondary | ICD-10-CM | POA: Insufficient documentation

## 2016-04-27 DIAGNOSIS — R569 Unspecified convulsions: Secondary | ICD-10-CM

## 2016-04-27 DIAGNOSIS — J45909 Unspecified asthma, uncomplicated: Secondary | ICD-10-CM | POA: Insufficient documentation

## 2016-04-27 DIAGNOSIS — G40909 Epilepsy, unspecified, not intractable, without status epilepticus: Secondary | ICD-10-CM | POA: Insufficient documentation

## 2016-04-27 LAB — CBG MONITORING, ED: Glucose-Capillary: 98 mg/dL (ref 65–99)

## 2016-04-27 MED ORDER — PHENYTOIN SODIUM EXTENDED 100 MG PO CAPS: 300.0000 mg | ORAL_CAPSULE | Freq: Three times a day (TID) | ORAL | 0 refills | Status: DC

## 2016-04-27 MED ORDER — PHENYTOIN SODIUM EXTENDED 100 MG PO CAPS
400.0000 mg | ORAL_CAPSULE | Freq: Once | ORAL | Status: AC
  Administered 2016-04-27: 400 mg via ORAL
  Filled 2016-04-27: qty 4

## 2016-04-27 MED ORDER — ALBUTEROL SULFATE HFA 108 (90 BASE) MCG/ACT IN AERS
2.0000 | INHALATION_SPRAY | Freq: Once | RESPIRATORY_TRACT | Status: AC
  Administered 2016-04-27: 2 via RESPIRATORY_TRACT
  Filled 2016-04-27: qty 6.7

## 2016-04-27 NOTE — ED Provider Notes (Signed)
[facility] DEPT Provider Note   CSN: [id number] Arrival date & time: 04/27/16  0045 By signing my name below, I, Levon HedgerElizabeth Hall, attest that this documentation has been prepared under the direction and in the presence of Melene Planan Retal Tonkinson, DO . Electronically Signed: Levon HedgerElizabeth Hall, Scribe. 04/27/2016. 1:51 AM.   History   Chief Complaint Chief Complaint  Patient presents with  . Seizures    around 10p    HPI Bruce Galvan is a 30 y.o. male with hx of asthma and seizure who presents to the Emergency Department complaining of a sudden onset seizure which occured last night around 10 pm. He state he was getting ready for work when the seizure occurred.Pt typically takes dilantin 300 mg 3x daily for seizures, but has not had his medication for 2 months due to lack of insurance. He is also requesting an albuterol inhaler. Pt denies any head injury or neck pain.   The history is provided by the patient. No language interpreter was used.    Past Medical History:  Diagnosis Date  . Asthma   . Seizure (HCC)     There are no active problems to display for this patient.  History reviewed. No pertinent surgical history.   Home Medications    Prior to Admission medications   Medication Sig Start Date End Date Taking? Authorizing Provider  acetaminophen-codeine 120-12 MG/5ML solution Take 10 mLs by mouth every 4 (four) hours as needed for moderate pain. 01/31/16   Charlestine Nighthristopher Lawyer, PA-C  albuterol (PROVENTIL HFA;VENTOLIN HFA) 108 (90 BASE) MCG/ACT inhaler Inhale 1-2 puffs into the lungs every 6 (six) hours as needed for wheezing or shortness of breath. 01/17/15   Joycie PeekBenjamin Cartner, PA-C  amoxicillin (AMOXIL) 875 MG tablet Take 1 tablet (875 mg total) by mouth 2 (two) times daily. 01/31/16   Charlestine Nighthristopher Lawyer, PA-C  ibuprofen (ADVIL,MOTRIN) 200 MG tablet Take 400 mg by mouth every 6 (six) hours as needed for headache (headache).     Historical Provider, MD  phenytoin (DILANTIN) 100 MG ER  capsule Take 3 capsules (300 mg total) by mouth 3 (three) times daily. 04/27/16   Melene Planan Renee Beale, DO    Family History Family History  Problem Relation Age of Onset  . Cancer Other     Social History Social History  Substance Use Topics  . Smoking status: Current Every Day Smoker    Packs/day: 0.75    Types: Cigarettes  . Smokeless tobacco: Never Used  . Alcohol use Yes     Comment: Once a week.     Allergies   Patient has no known allergies.  Review of Systems Review of Systems  Constitutional: Negative for chills and fever.  HENT: Negative for congestion and facial swelling.   Eyes: Negative for discharge and visual disturbance.  Respiratory: Negative for shortness of breath.   Cardiovascular: Negative for chest pain and palpitations.  Gastrointestinal: Negative for abdominal pain, diarrhea and vomiting.  Musculoskeletal: Negative for arthralgias, myalgias and neck pain.  Skin: Negative for color change and rash.  Neurological: Positive for seizures. Negative for tremors, syncope and headaches.  Hematological: Negative for adenopathy.  Psychiatric/Behavioral: Negative for confusion and dysphoric mood.   Physical Exam Updated Vital Signs BP 123/81 (BP Location: Left Arm)   Pulse 72   Temp 98.4 F (36.9 C)   Resp 20   Ht 5\' 9"  (1.753 m)   Wt 134 lb 2 oz (60.8 kg)   SpO2 95%   BMI 19.81 kg/m   Physical Exam  Constitutional: He is oriented to person, place, and time. He appears well-developed and well-nourished.  HENT:  Head: Normocephalic and atraumatic.  Eyes: EOM are normal. Pupils are equal, round, and reactive to light.  Neck: Normal range of motion. Neck supple. No JVD present.  Cardiovascular: Normal rate and regular rhythm.  Exam reveals no gallop and no friction rub.   No murmur heard. Pulmonary/Chest: No respiratory distress. He has no wheezes.  Abdominal: He exhibits no distension. There is no rebound and no guarding.  Musculoskeletal: Normal range of  motion.  Neurological: He is alert and oriented to person, place, and time.  Skin: No rash noted. No pallor.  Psychiatric: He has a normal mood and affect. His behavior is normal.  Nursing note and vitals reviewed.   ED Treatments / Results  DIAGNOSTIC STUDIES:  Oxygen Saturation is 99% on RA, normal by my interpretation.    COORDINATION OF CARE:  1:45 AM Discussed treatment plan with pt at bedside and pt agreed to plan.  Labs (all labs ordered are listed, but only abnormal results are displayed) Labs Reviewed  CBG MONITORING, ED    EKG  EKG Interpretation None       Radiology No results found.  Procedures Procedures (including critical care time)  Medications Ordered in ED Medications  phenytoin (DILANTIN) ER capsule 400 mg (400 mg Oral Given 04/27/16 0216)  albuterol (PROVENTIL HFA;VENTOLIN HFA) 108 (90 Base) MCG/ACT inhaler 2 puff (2 puffs Inhalation Given 04/27/16 0219)     Initial Impression / Assessment and Plan / ED Course  I have reviewed the triage vital signs and the nursing notes.  Pertinent labs & imaging results that were available during my care of the patient were reviewed by me and considered in my medical decision making (see chart for details).  Clinical Course     30 yo F with a cc a seizure. Patient has a seizure disorder is on Dilantin but has not taken his medication in at least 2 months. He states it's too expensive to buy. Patient is back at his baseline. Has no neurologic deficit. I discussed with the patient about following up with a different doctor who will see him without insurance. He states that he should be able to get his prescription filled.  3:14 AM:  I have discussed the diagnosis/risks/treatment options with the patient and family and believe the pt to be eligible for discharge home to follow-up with PCP. We also discussed returning to the ED immediately if new or worsening sx occur. We discussed the sx which are most concerning  (e.g., sudden worsening pain, fever, inability to tolerate by mouth) that necessitate immediate return. Medications administered to the patient during their visit and any new prescriptions provided to the patient are listed below.  Medications given during this visit Medications  phenytoin (DILANTIN) ER capsule 400 mg (400 mg Oral Given 04/27/16 0216)  albuterol (PROVENTIL HFA;VENTOLIN HFA) 108 (90 Base) MCG/ACT inhaler 2 puff (2 puffs Inhalation Given 04/27/16 0219)     The patient appears reasonably screen and/or stabilized for discharge and I doubt any other medical condition or other [facility] requiring further screening, evaluation, or treatment in the ED at this time prior to discharge.    Final Clinical Impressions(s) / ED Diagnoses   Final diagnoses:  Seizure [facility](HCC)    New Prescriptions Discharge Medication List as of 04/27/2016  1:56 AM     I personally performed the services described in this documentation, which was scribed in my presence.  The recorded information has been reviewed and is accurate.     Melene Plan, DO 04/27/16 [phone]

## 2016-04-27 NOTE — ED Triage Notes (Signed)
Pt states that he had a seizure around 10p last night. Pt has a known history of seizure. Pt also requesting an albuterol inhaler. Able to speak in complete sentences. Not post ictal. A&Ox4. Ambulatory.

## 2016-04-27 NOTE — ED Notes (Signed)
No reaction to medication noted. 

## 2016-04-27 NOTE — Discharge Instructions (Signed)
Take 400mg  in two hours, then 400mg  2 hours after that.  Then take your medication as prescribed.

## 2016-04-27 NOTE — ED Notes (Signed)
Alert and oriented x 3 call light in reach pt refused IV and cardiac monitoring states he just needs his dilantin refilled seizure precautions in effect.

## 2016-05-07 ENCOUNTER — Encounter (HOSPITAL_COMMUNITY): Payer: Self-pay | Admitting: Emergency Medicine

## 2016-05-07 ENCOUNTER — Emergency Department (HOSPITAL_COMMUNITY)
Admission: EM | Admit: 2016-05-07 | Discharge: 2016-05-07 | Disposition: A | Discharge status: Home / Self Care | Payer: Self-pay | Attending: Emergency Medicine | Admitting: Emergency Medicine

## 2016-05-07 ENCOUNTER — Emergency Department (HOSPITAL_COMMUNITY): Payer: Self-pay

## 2016-05-07 ENCOUNTER — Emergency Department (HOSPITAL_COMMUNITY)
Admission: EM | Admit: 2016-05-07 | Discharge: 2016-05-07 | Disposition: A | Discharge status: Home / Self Care | Payer: Self-pay | Attending: Dermatology | Admitting: Dermatology

## 2016-05-07 DIAGNOSIS — R05 Cough: Secondary | ICD-10-CM | POA: Insufficient documentation

## 2016-05-07 DIAGNOSIS — Z5321 Procedure and treatment not carried out due to patient leaving prior to being seen by health care provider: Secondary | ICD-10-CM | POA: Insufficient documentation

## 2016-05-07 DIAGNOSIS — R109 Unspecified abdominal pain: Secondary | ICD-10-CM | POA: Insufficient documentation

## 2016-05-07 DIAGNOSIS — Y929 Unspecified place or not applicable: Secondary | ICD-10-CM | POA: Insufficient documentation

## 2016-05-07 DIAGNOSIS — F17210 Nicotine dependence, cigarettes, uncomplicated: Secondary | ICD-10-CM | POA: Insufficient documentation

## 2016-05-07 DIAGNOSIS — X509XXA Other and unspecified overexertion or strenuous movements or postures, initial encounter: Secondary | ICD-10-CM | POA: Insufficient documentation

## 2016-05-07 DIAGNOSIS — Z79899 Other long term (current) drug therapy: Secondary | ICD-10-CM | POA: Insufficient documentation

## 2016-05-07 DIAGNOSIS — J45909 Unspecified asthma, uncomplicated: Secondary | ICD-10-CM | POA: Insufficient documentation

## 2016-05-07 DIAGNOSIS — Y999 Unspecified external cause status: Secondary | ICD-10-CM | POA: Insufficient documentation

## 2016-05-07 DIAGNOSIS — Y939 Activity, unspecified: Secondary | ICD-10-CM | POA: Insufficient documentation

## 2016-05-07 DIAGNOSIS — S2232XA Fracture of one rib, left side, initial encounter for closed fracture: Secondary | ICD-10-CM | POA: Insufficient documentation

## 2016-05-07 MED ORDER — HYDROCODONE-ACETAMINOPHEN 5-325 MG PO TABS: 1.0000 | ORAL_TABLET | ORAL | 0 refills | Status: DC | PRN

## 2016-05-07 MED ORDER — HYDROCODONE-ACETAMINOPHEN 5-325 MG PO TABS
1.0000 | ORAL_TABLET | Freq: Once | ORAL | Status: AC
Start: 2016-05-07 — End: 2016-05-07
  Administered 2016-05-07: 1 via ORAL
  Filled 2016-05-07: qty 1

## 2016-05-07 NOTE — ED Notes (Signed)
Pt called to be brought back to treatment room; no answer

## 2016-05-07 NOTE — ED Provider Notes (Signed)
[facility]-EMERGENCY DEPT Provider Note   CSN: [id number] Arrival date & time: 05/07/16  1531  By signing my name below, I, Sonum Patel, attest that this documentation has been prepared under the direction and in the presence of [place], [place]. Electronically Signed: Sonum Patel, Neurosurgeoncribe. 05/07/16. 5:02 PM.  History   Chief Complaint Chief Complaint  Patient presents with  . rib cage pain    The history is provided by the patient. No language interpreter was used.    HPI Comments: Bruce Galvan is a 30 y.o. male who presents to the Emergency Department complaining of sudden onset left lateral rib pain that began last night after a coughing spell. He states the past three days he has had a dry cough and has had some bilateral rib cage soreness when coughing. He states then last night he was coughing when suddenly felt a large pop in his left side. He states the pain is now severe whenever he coughs or moves. He states it does not hurt to breathe and he can breathe comfortably. Marland Kitchen. He denies seeing any associated bruising or swelling to the affected area. He denies any other issues at this time. Denies other injury or trauma. Denies chest pain or SOB.  Past Medical History:  Diagnosis Date  . Asthma   . Seizure (HCC)     There are no active problems to display for this patient.   History reviewed. No pertinent surgical history.   Home Medications    Prior to Admission medications   Medication Sig Start Date End Date Taking? Authorizing Provider  acetaminophen-codeine 120-12 MG/5ML solution Take 10 mLs by mouth every 4 (four) hours as needed for moderate pain. 01/31/16   Charlestine Nighthristopher Lawyer, PA-C  albuterol (PROVENTIL HFA;VENTOLIN HFA) 108 (90 BASE) MCG/ACT inhaler Inhale 1-2 puffs into the lungs every 6 (six) hours as needed for wheezing or shortness of breath. 01/17/15   Joycie PeekBenjamin Cartner, PA-C  amoxicillin (AMOXIL) 875 MG tablet Take 1 tablet (875 mg total) by mouth 2 (two) times daily.  01/31/16   Charlestine Nighthristopher Lawyer, PA-C  ibuprofen (ADVIL,MOTRIN) 200 MG tablet Take 400 mg by mouth every 6 (six) hours as needed for headache (headache).     Historical Provider, MD  phenytoin (DILANTIN) 100 MG ER capsule Take 3 capsules (300 mg total) by mouth 3 (three) times daily. 04/27/16   Melene Planan Floyd, DO    Family History Family History  Problem Relation Age of Onset  . Cancer Other     Social History Social History  Substance Use Topics  . Smoking status: Current Every Day Smoker    Packs/day: 0.75    Types: Cigarettes  . Smokeless tobacco: Never Used  . Alcohol use Yes     Comment: Once a week.      Allergies   Patient has no known allergies.   Review of Systems Review of Systems  Respiratory: Positive for cough.   Cardiovascular: Negative for chest pain.  All other systems reviewed and are negative.    Physical Exam Updated Vital Signs BP 133/82 (BP Location: Left Arm)   Pulse 65   Temp 98.1 F (36.7 C) (Oral)   Resp 18   SpO2 99%   Physical Exam  Constitutional: He is oriented to person, place, and time. No distress.  HENT:  Head: Atraumatic.  Right Ear: External ear normal.  Left Ear: External ear normal.  Nose: Nose normal.  Eyes: Conjunctivae are normal. No scleral icterus.  Cardiovascular: Normal rate, regular rhythm,  normal heart sounds and intact distal pulses.   Pulmonary/Chest: Effort normal and breath sounds normal. No respiratory distress. He has no wheezes. He has no rales.     He exhibits tenderness.  Lateral left ribs diffusely mildly tender with area of point tenderness as depicted. Otherwise breathing comfortably with no tachypnea and equal expansion bilaterally.  Abdominal: He exhibits no distension.  Neurological: He is alert and oriented to person, place, and time.  Skin: Skin is warm and dry. He is not diaphoretic.  Psychiatric: He has a normal mood and affect. His behavior is normal.  Nursing note and vitals reviewed.    ED  Treatments / Results  DIAGNOSTIC STUDIES: Oxygen Saturation is 99% on RA, normal by my interpretation.    COORDINATION OF CARE: 5:06 PM Discussed treatment plan with pt at bedside and pt agreed to plan.    Labs (all labs ordered are listed, but only abnormal results are displayed) Labs Reviewed - No data to display  EKG  EKG Interpretation None       Radiology Dg Ribs Unilateral W/chest Left  Result Date: 05/07/2016 CLINICAL DATA:  LEFT lateral rib pain and cough for 3 days. No known injury. History of asthma. EXAM: LEFT RIBS AND CHEST - 3+ VIEW COMPARISON:  Chest radiograph 03/18/2016. FINDINGS: Suspected minimally displaced fracture of the LEFT anterior seventh rib, adjacent to the BB. See arrows on the oblique views. There is no evidence of pneumothorax or pleural effusion. Both lungs are clear. Heart size and mediastinal contours are within normal limits. IMPRESSION: Suspected minimally displaced fracture of the LEFT anterior seventh rib. Electronically Signed   By: Elsie StainJohn T Curnes M.D.   On: 05/07/2016 16:31    Procedures Procedures (including critical care time)  Medications Ordered in ED Medications - No data to display   Initial Impression / Assessment and Plan / ED Course  I have reviewed the triage vital signs and the nursing notes.  Pertinent labs & imaging results that were available during my care of the patient were reviewed by me and considered in my medical decision making (see chart for details).  Clinical Course    X-ray reveals minimally displaced left anterior seventh rib fracture. No PTX, pneumonia, or other abnormalities. Pt is breathing comfortably with no tachypnea or hypoxia.will give course of pain meds. Incentive spirometer given. Encouraged follow up with ortho. Referral to sickle cell clinic also given to establish PCP since [facility] cannot take any new patients currently.  Final Clinical Impressions(s) / ED Diagnoses   Final diagnoses:  Closed  fracture of one rib of left side, initial encounter    New Prescriptions New Prescriptions   HYDROCODONE-ACETAMINOPHEN (NORCO/VICODIN) 5-325 MG TABLET    Take 1 tablet by mouth every 4 (four) hours as needed.   I personally performed the services described in this documentation, which was scribed in my presence. The recorded information has been reviewed and is accurate.    Carlene CoriaSerena Y Jackeline Gutknecht, PA-C 05/07/16 1757    Jacalyn LefevreJulie Haviland, MD 05/07/16 2025

## 2016-05-07 NOTE — ED Triage Notes (Signed)
Per pt, states cough for a few days-states last night he was coughing and felt a "pop" on his left rib cage-states came on suddenly

## 2016-05-07 NOTE — ED Notes (Signed)
Pt called another two times in waiting room; no answer

## 2016-05-07 NOTE — ED Triage Notes (Signed)
Pt states that he has had a cough x 2 days and now has bilateral flank pain which is worse when he coughs hard or moves. Alert and oriented.

## 2016-05-07 NOTE — Discharge Instructions (Signed)
Take pain medication as prescribed. Follow up with Dr. Eulah PontMurphy of orthopedic surgery. I also gave you the phone number for [facility]. Please call them to establish primary care. They take good care of patients without Sickle Cell disease as well. Return to the ER for new or worsening symptoms.

## 2016-05-10 ENCOUNTER — Emergency Department (HOSPITAL_COMMUNITY): Payer: Self-pay

## 2016-05-10 ENCOUNTER — Emergency Department (HOSPITAL_COMMUNITY)
Admission: EM | Admit: 2016-05-10 | Discharge: 2016-05-10 | Disposition: A | Discharge status: Home / Self Care | Payer: Self-pay | Attending: Emergency Medicine | Admitting: Emergency Medicine

## 2016-05-10 DIAGNOSIS — Y929 Unspecified place or not applicable: Secondary | ICD-10-CM | POA: Insufficient documentation

## 2016-05-10 DIAGNOSIS — R059 Cough, unspecified: Secondary | ICD-10-CM

## 2016-05-10 DIAGNOSIS — X58XXXA Exposure to other specified factors, initial encounter: Secondary | ICD-10-CM | POA: Insufficient documentation

## 2016-05-10 DIAGNOSIS — Y999 Unspecified external cause status: Secondary | ICD-10-CM | POA: Insufficient documentation

## 2016-05-10 DIAGNOSIS — R05 Cough: Secondary | ICD-10-CM | POA: Insufficient documentation

## 2016-05-10 DIAGNOSIS — F17210 Nicotine dependence, cigarettes, uncomplicated: Secondary | ICD-10-CM | POA: Insufficient documentation

## 2016-05-10 DIAGNOSIS — S2231XA Fracture of one rib, right side, initial encounter for closed fracture: Secondary | ICD-10-CM | POA: Insufficient documentation

## 2016-05-10 DIAGNOSIS — J45909 Unspecified asthma, uncomplicated: Secondary | ICD-10-CM | POA: Insufficient documentation

## 2016-05-10 DIAGNOSIS — Y939 Activity, unspecified: Secondary | ICD-10-CM | POA: Insufficient documentation

## 2016-05-10 DIAGNOSIS — R569 Unspecified convulsions: Secondary | ICD-10-CM | POA: Insufficient documentation

## 2016-05-10 LAB — CBC WITH DIFFERENTIAL/PLATELET
Basophils Absolute: 0.1 10*3/uL (ref 0.0–0.1)
Basophils Relative: 0 %
EOS ABS: 0.2 10*3/uL (ref 0.0–0.7)
EOS PCT: 1 %
HCT: 45.1 % (ref 39.0–52.0)
Hemoglobin: 15.6 g/dL (ref 13.0–17.0)
LYMPHS ABS: 3.2 10*3/uL (ref 0.7–4.0)
Lymphocytes Relative: 23 %
MCH: 31.5 pg (ref 26.0–34.0)
MCHC: 34.6 g/dL (ref 30.0–36.0)
MCV: 90.9 fL (ref 78.0–100.0)
MONO ABS: 1.2 10*3/uL — AB (ref 0.1–1.0)
MONOS PCT: 8 %
Neutro Abs: 9.5 10*3/uL — ABNORMAL HIGH (ref 1.7–7.7)
Neutrophils Relative %: 68 %
PLATELETS: 231 10*3/uL (ref 150–400)
RBC: 4.96 MIL/uL (ref 4.22–5.81)
RDW: 14 % (ref 11.5–15.5)
WBC: 14.1 10*3/uL — ABNORMAL HIGH (ref 4.0–10.5)

## 2016-05-10 LAB — COMPREHENSIVE METABOLIC PANEL
ALK PHOS: 79 U/L (ref 38–126)
ALT: 18 U/L (ref 17–63)
ANION GAP: 8 (ref 5–15)
AST: 25 U/L (ref 15–41)
Albumin: 4.2 g/dL (ref 3.5–5.0)
BUN: 11 mg/dL (ref 6–20)
CALCIUM: 8.9 mg/dL (ref 8.9–10.3)
CHLORIDE: 107 mmol/L (ref 101–111)
CO2: 23 mmol/L (ref 22–32)
Creatinine, Ser: 0.89 mg/dL (ref 0.61–1.24)
GFR calc non Af Amer: >60 mL/min (ref >60)
Glucose, Bld: 83 mg/dL (ref 65–99)
POTASSIUM: 4.3 mmol/L (ref 3.5–5.1)
SODIUM: 138 mmol/L (ref 135–145)
Total Bilirubin: 0.4 mg/dL (ref 0.3–1.2)
Total Protein: 7.5 g/dL (ref 6.5–8.1)

## 2016-05-10 LAB — CBG MONITORING, ED: GLUCOSE-CAPILLARY: 92 mg/dL (ref 65–99)

## 2016-05-10 LAB — PHENYTOIN LEVEL, TOTAL: Phenytoin Lvl: <2.5 ug/mL — ABNORMAL LOW (ref 10.0–20.0)

## 2016-05-10 MED ORDER — DEXAMETHASONE SODIUM PHOSPHATE 10 MG/ML IJ SOLN
10.0000 mg | Freq: Once | INTRAMUSCULAR | Status: AC
  Administered 2016-05-10: 10 mg via INTRAMUSCULAR
  Filled 2016-05-10: qty 1

## 2016-05-10 MED ORDER — HYDROCODONE-HOMATROPINE 5-1.5 MG/5ML PO SYRP: 5.0000 mL | ORAL_SOLUTION | Freq: Four times a day (QID) | ORAL | 0 refills | Status: DC | PRN

## 2016-05-10 MED ORDER — LORAZEPAM 2 MG/ML IJ SOLN: 2.0000 mg | INTRAMUSCULAR | Status: DC | PRN

## 2016-05-10 MED ORDER — ALBUTEROL SULFATE HFA 108 (90 BASE) MCG/ACT IN AERS
2.0000 | INHALATION_SPRAY | RESPIRATORY_TRACT | Status: DC | PRN
  Administered 2016-05-10: 2 via RESPIRATORY_TRACT
  Filled 2016-05-10: qty 6.7

## 2016-05-10 NOTE — Discharge Instructions (Signed)
Your may not drive within 6 months of having a seizure per [place] law.  Please follow-up with the clinic listed above.  You need to have your rib fractures investigated further on an outpatient basis.

## 2016-05-10 NOTE — ED Notes (Signed)
ED Provider at bedside. 

## 2016-05-10 NOTE — ED Triage Notes (Signed)
Pt from home via EMS- Per EMS, per pt roommate, pt had two seizures this am. Pt reports that he has not been compliant with seizure med. Pt was post-ictal on scene, but A&O upon arrival to ED. Pt in NAD

## 2016-05-10 NOTE — ED Notes (Signed)
Bed: RESB Expected date: 05/10/16 Expected time: 3:05 PM Means of arrival: Ambulance Comments: Seizure-noncompliant with meds

## 2016-05-10 NOTE — ED Notes (Signed)
Patient transported to X-ray 

## 2016-05-10 NOTE — ED Provider Notes (Signed)
[facility] Provider Note   CSN: [id number] Arrival date & time: 05/10/16  1502     History   Chief Complaint Chief Complaint  Patient presents with  . Seizures    HPI Bruce Galvan is a 30 y.o. male.  Patient with PMH of seizures and asthma presents to the ED with a chief complaint of seizure.  He is accompanied by a friend who witnessed the seizure.  The friend states that he had two episodes of full body shaking back to back each lasting about 2 minutes.  Patient states that he takes Dilantin, but missed his dose yesterday.  He states that he doubled up on his Dilantin this morning immediately prior to seizing.  Additionally, patient reports that he has had a cough for the past week or so.  He was seen several days ago after having a coughing fit and feeling a pop in his left lower back.  It was found that he had a rib fracture presumably from the coughing at that time.  Patient now states that he is concerned about his right side as well because he is having increased pain over his right ribs with coughing now.  He denies having productive cough, fever, or chills.  Denies any other symptoms.   The history is provided by the patient. No language interpreter was used.    Past Medical History:  Diagnosis Date  . Asthma   . Seizure (HCC)     There are no active problems to display for this patient.   No past surgical history on file.     Home Medications    Prior to Admission medications   Medication Sig Start Date End Date Taking? Authorizing Provider  acetaminophen-codeine 120-12 MG/5ML solution Take 10 mLs by mouth every 4 (four) hours as needed for moderate pain. 01/31/16   Charlestine Nighthristopher Lawyer, PA-C  albuterol (PROVENTIL HFA;VENTOLIN HFA) 108 (90 BASE) MCG/ACT inhaler Inhale 1-2 puffs into the lungs every 6 (six) hours as needed for wheezing or shortness of breath. 01/17/15   Joycie PeekBenjamin Cartner, PA-C  amoxicillin (AMOXIL) 875 MG tablet Take 1 tablet (875 mg total)  by mouth 2 (two) times daily. 01/31/16   Charlestine Nighthristopher Lawyer, PA-C  HYDROcodone-acetaminophen (NORCO/VICODIN) 5-325 MG tablet Take 1 tablet by mouth every 4 (four) hours as needed. 05/07/16   Ace GinsSerena Y Sam, PA-C  ibuprofen (ADVIL,MOTRIN) 200 MG tablet Take 400 mg by mouth every 6 (six) hours as needed for headache (headache).     Historical Provider, MD  phenytoin (DILANTIN) 100 MG ER capsule Take 3 capsules (300 mg total) by mouth 3 (three) times daily. 04/27/16   Melene Planan Floyd, DO    Family History Family History  Problem Relation Age of Onset  . Cancer Other     Social History Social History  Substance Use Topics  . Smoking status: Current Every Day Smoker    Packs/day: 0.75    Types: Cigarettes  . Smokeless tobacco: Never Used  . Alcohol use Yes     Comment: Once a week.      Allergies   Patient has no known allergies.   Review of Systems Review of Systems  Respiratory: Positive for cough.   Neurological: Positive for seizures.  All other systems reviewed and are negative.    Physical Exam Updated Vital Signs BP 131/70 (BP Location: Left Arm)   Pulse 95   Temp 98.5 F (36.9 C) (Oral)   Resp 18   SpO2 96%   Physical Exam  Constitutional: He is oriented to person, place, and time. He appears well-developed and well-nourished.  HENT:  Head: Normocephalic and atraumatic.  Eyes: Conjunctivae and EOM are normal. Pupils are equal, round, and reactive to light. Right eye exhibits no discharge. Left eye exhibits no discharge. No scleral icterus.  Neck: Normal range of motion. Neck supple. No JVD present.  Cardiovascular: Normal rate, regular rhythm and normal heart sounds.  Exam reveals no gallop and no friction rub.   No murmur heard. Pulmonary/Chest: Effort normal and breath sounds normal. No respiratory distress. He has no wheezes. He has no rales. He exhibits no tenderness.  Right anterior inferior ribs ttp Left posterior inferior ribs ttp CTAB  Abdominal: Soft. He  exhibits no distension and no mass. There is no tenderness. There is no rebound and no guarding.  Musculoskeletal: Normal range of motion. He exhibits no edema or tenderness.  Neurological: He is alert and oriented to person, place, and time.  Skin: Skin is warm and dry.  Psychiatric: He has a normal mood and affect. His behavior is normal. Judgment and thought content normal.  Nursing note and vitals reviewed.    ED Treatments / Results  Labs (all labs ordered are listed, but only abnormal results are displayed) Labs Reviewed  PHENYTOIN LEVEL, TOTAL  CBG MONITORING, ED  I-STAT CHEM 8, ED    EKG  EKG Interpretation None       Radiology Dg Ribs Unilateral W/chest Right  Result Date: 05/10/2016 CLINICAL DATA:  Right rib pain anterior/inferior post seizures this morning. Known left rib fracture. EXAM: RIGHT RIBS AND CHEST - 3+ VIEW COMPARISON:  05/07/2016 and chest x-ray 03/18/2016 FINDINGS: Lungs are adequately inflated and otherwise clear. Cardiomediastinal silhouette is within normal. There is a minimally displaced fracture of the right anterior sixth rib. IMPRESSION: No acute cardiopulmonary disease per Minimally displaced acute fracture of the anterior right sixth rib. Electronically Signed   By: Elberta Fortisaniel  Boyle M.D.   On: 05/10/2016 16:08    Procedures Procedures (including critical care time)  Medications Ordered in ED Medications  LORazepam (ATIVAN) injection 2 mg (not administered)     Initial Impression / Assessment and Plan / ED Course  I have reviewed the triage vital signs and the nursing notes.  Pertinent labs & imaging results that were available during my care of the patient were reviewed by me and considered in my medical decision making (see chart for details).  Clinical Course     Patient here with seizure.  2 episodes each lasting about 2 minutes.  Non-compliant on Dilantin.  Suspect this is the cause, but also could have lower threshold due to  URI.  Right inferior anterior rib 6th fracture seen on x-ray.  Discussed with Dr. Radford PaxBeaton, who agrees with additional lab work given fractures from coughing.    Labs are reassuring.  Phenytoin is low as expected.  Patient doubled his dose earlier today.  Advised him to take it as prescribed without missing any doses.  Advised follow-up with PCP regarding fractures.  He may need additional workup to rule out further underlying etiologies of fractures.  Final Clinical Impressions(s) / ED Diagnoses   Final diagnoses:  Seizure (HCC)  Cough  Closed fracture of one rib of right side, initial encounter    New Prescriptions New Prescriptions   No medications on file     Roxy HorsemanRobert Gretta Samons, PA-C 05/10/16 2131    Nelva Nayobert Beaton, MD 05/11/16 1224

## 2016-06-02 ENCOUNTER — Encounter (HOSPITAL_COMMUNITY): Payer: Self-pay | Admitting: Family Medicine

## 2016-06-02 ENCOUNTER — Emergency Department (HOSPITAL_COMMUNITY)
Admission: EM | Admit: 2016-06-02 | Discharge: 2016-06-02 | Disposition: A | Discharge status: Home / Self Care | Payer: Self-pay | Attending: Emergency Medicine | Admitting: Emergency Medicine

## 2016-06-02 DIAGNOSIS — R51 Headache: Secondary | ICD-10-CM | POA: Insufficient documentation

## 2016-06-02 DIAGNOSIS — F17210 Nicotine dependence, cigarettes, uncomplicated: Secondary | ICD-10-CM | POA: Insufficient documentation

## 2016-06-02 DIAGNOSIS — R519 Headache, unspecified: Secondary | ICD-10-CM

## 2016-06-02 DIAGNOSIS — R569 Unspecified convulsions: Secondary | ICD-10-CM | POA: Insufficient documentation

## 2016-06-02 MED ORDER — PHENYTOIN SODIUM EXTENDED 100 MG PO CAPS: 300.0000 mg | ORAL_CAPSULE | Freq: Every day | ORAL | 0 refills | Status: DC

## 2016-06-02 MED ORDER — PHENYTOIN SODIUM EXTENDED 100 MG PO CAPS
300.0000 mg | ORAL_CAPSULE | Freq: Once | ORAL | Status: AC
  Administered 2016-06-02: 300 mg via ORAL
  Filled 2016-06-02: qty 3

## 2016-06-02 NOTE — ED Notes (Signed)
Patient reports being out of seizure medication.  Patient states his headache has resolved since taking pain medication. He reports he took his last dose of seizure medication last night and is now out of the medication. Patient reports having seizure this afternoon due to not taking medication this morning.

## 2016-06-02 NOTE — ED Triage Notes (Signed)
Patient reports he had a seizure today around 13:30. After seizure, he reports experiencing a throbbing, temporal headache. Pt reports he normally has headaches after his seizure but usually does not stay as long as this one has lasted. Pt took IBUPROFEN 2 tablets at 14:30. Pt drove himself to the emergency department and using cell phone while triaging.

## 2016-06-03 NOTE — ED Provider Notes (Signed)
[facility] Provider Note   CSN: [id number] Arrival date & time: 06/02/16  1801     History   Chief Complaint Chief Complaint  Patient presents with  . Headache    HPI Bruce Galvan is a 31 y.o. male.  Breakthrough seizure then had a headach that lasted longer than normal after a seizure so came to ed to be evaluated. At the time of my exam, no longer had a headache. No persistent neurologic symptoms.     Headache   This is a recurrent problem. The current episode started 6 to 12 hours ago. The problem has been resolved. Associated with: seizure. The patient is experiencing no pain. The pain does not radiate.    Past Medical History:  Diagnosis Date  . Asthma   . Seizure (HCC)     There are no active problems to display for this patient.   History reviewed. No pertinent surgical history.     Home Medications    Prior to Admission medications   Medication Sig Start Date End Date Taking? Authorizing Provider  albuterol (PROVENTIL HFA;VENTOLIN HFA) 108 (90 BASE) MCG/ACT inhaler Inhale 1-2 puffs into the lungs every 6 (six) hours as needed for wheezing or shortness of breath. 01/17/15  Yes Benjamin Cartner, PA-C  ibuprofen (ADVIL,MOTRIN) 200 MG tablet Take 400 mg by mouth every 6 (six) hours as needed for headache (headache).    Yes Historical Provider, MD  phenytoin (DILANTIN) 100 MG ER capsule Take 3 capsules (300 mg total) by mouth 3 (three) times daily. 04/27/16  Yes Melene Plan, DO  phenytoin (DILANTIN) 100 MG ER capsule Take 3 capsules (300 mg total) by mouth daily. 06/02/16   Marily Memos, MD    Family History Family History  Problem Relation Age of Onset  . Cancer Other     Social History Social History  Substance Use Topics  . Smoking status: Current Every Day Smoker    Packs/day: 0.75    Types: Cigarettes  . Smokeless tobacco: Never Used  . Alcohol use Yes     Comment: Once a week.      Allergies   Patient has no known  allergies.   Review of Systems Review of Systems  Neurological: Positive for headaches.  All other systems reviewed and are negative.    Physical Exam Updated Vital Signs BP 133/81 (BP Location: Left Arm)   Pulse 68   Temp 97.9 F (36.6 C) (Oral)   Resp 18   Ht 5\' 9"  (1.753 m)   Wt 135 lb (61.2 kg)   SpO2 99%   BMI 19.94 kg/m   Physical Exam  Constitutional: He is oriented to person, place, and time. He appears well-developed and well-nourished.  HENT:  Head: Normocephalic and atraumatic.  Eyes: Conjunctivae and EOM are normal. Pupils are equal, round, and reactive to light.  Neck: Normal range of motion.  Cardiovascular: Normal rate.   Pulmonary/Chest: Effort normal. No respiratory distress.  Abdominal: He exhibits no distension.  Musculoskeletal: Normal range of motion. He exhibits no edema or deformity.  Neurological: He is alert and oriented to person, place, and time.  No altered mental status, able to give full seemingly accurate history.  Face is symmetric, EOM's intact, pupils equal and reactive, vision intact, tongue and uvula midline without deviation Upper and Lower extremity motor 5/5, intact pain perception in distal extremities, 2+ reflexes in biceps, patella and achilles tendons. Finger to nose normal, heel to shin normal. Walks without assistance or evident ataxia.  Nursing note and vitals reviewed.    ED Treatments / Results  Labs (all labs ordered are listed, but only abnormal results are displayed) Labs Reviewed - No data to display  EKG  EKG Interpretation None       Radiology No results found.  Procedures Procedures (including critical care time)  Medications Ordered in ED Medications  phenytoin (DILANTIN) ER capsule 300 mg (300 mg Oral Given 06/02/16 2201)     Initial Impression / Assessment and Plan / ED Course  I have reviewed the triage vital signs and the nursing notes.  Pertinent labs & imaging results that were available  during my care of the patient were reviewed by me and considered in my medical decision making (see chart for details).  Clinical Course     Headache after breakthrough seizure b/c of noncompliance. Headache resolved prior to my eval. Will refill seizure meds. Low suspicion for other intracranial abnormalities.   Final Clinical Impressions(s) / ED Diagnoses   Final diagnoses:  Seizure (HCC)  Nonintractable headache, unspecified chronicity pattern, unspecified headache type    New Prescriptions Discharge Medication List as of 06/02/2016  9:46 PM    START taking these medications   Details  !! phenytoin (DILANTIN) 100 MG ER capsule Take 3 capsules (300 mg total) by mouth daily., Starting Mon 06/02/2016, Print     !! - Potential duplicate medications found. Please discuss with provider.       Marily MemosJason Taite Schoeppner, MD 06/03/16 [phone] [id number]

## 2018-04-10 ENCOUNTER — Emergency Department
Admission: EM | Admit: 2018-04-10 | Discharge: 2018-04-10 | Disposition: A | Discharge status: Home / Self Care | Payer: Self-pay | Attending: Emergency Medicine | Admitting: Emergency Medicine

## 2018-04-10 DIAGNOSIS — R569 Unspecified convulsions: Secondary | ICD-10-CM | POA: Insufficient documentation

## 2018-04-10 DIAGNOSIS — F17210 Nicotine dependence, cigarettes, uncomplicated: Secondary | ICD-10-CM | POA: Insufficient documentation

## 2018-04-10 DIAGNOSIS — J45909 Unspecified asthma, uncomplicated: Secondary | ICD-10-CM | POA: Insufficient documentation

## 2018-04-10 MED ORDER — PHENYTOIN SODIUM EXTENDED 100 MG PO CAPS
300.0000 mg | ORAL_CAPSULE | Freq: Once | ORAL | Status: AC
  Administered 2018-04-10: 300 mg via ORAL
  Filled 2018-04-10: qty 3

## 2018-04-10 MED ORDER — PHENYTOIN SODIUM EXTENDED 100 MG PO CAPS: 300.0000 mg | ORAL_CAPSULE | Freq: Every day | ORAL | 0 refills | Status: DC

## 2018-04-10 NOTE — ED Provider Notes (Addendum)
[facility] Emergency Department Provider Note  ____________________________________________   I have reviewed the triage vital signs and the nursing notes. Where available I have reviewed prior notes and, if possible and indicated, outside hospital notes.    HISTORY  Chief Complaint Seizures    HPI Bruce Galvan is a 32 y.o. male who presents today complaining of having had a seizure.  Did lightly bite his tongue.  Patient states that he does not have a headache or any significant injury, he does not want any blood work, he has had seizures since he was 30 and is now 31, states he has not taken his Dilantin and a approximately 2 weeks, he knows his levels will be low.  He is willing to take his medications.  He does not want a CT scan or any imaging, he feels 100% as he always does after a seizure.  According to EMS it was a witnessed grand mal seizure with normal glucose.    Past Medical History:  Diagnosis Date  . Asthma   . Seizure (HCC)     There are no active problems to display for this patient.   History reviewed. No pertinent surgical history.  Prior to Admission medications   Medication Sig Start Date End Date Taking? Authorizing Provider  albuterol (PROVENTIL HFA;VENTOLIN HFA) 108 (90 BASE) MCG/ACT inhaler Inhale 1-2 puffs into the lungs every 6 (six) hours as needed for wheezing or shortness of breath. 01/17/15   Cartner, Sharlet Salina, PA-C  ibuprofen (ADVIL,MOTRIN) 200 MG tablet Take 400 mg by mouth every 6 (six) hours as needed for headache (headache).     [provider]  phenytoin (DILANTIN) 100 MG ER capsule Take 3 capsules (300 mg total) by mouth 3 (three) times daily. 04/27/16   Melene Plan, DO  phenytoin (DILANTIN) 100 MG ER capsule Take 3 capsules (300 mg total) by mouth daily. 06/02/16   Mesner, Barbara Cower, MD    Allergies Patient has no known allergies.  Family History  Problem Relation Age of Onset  . Cancer Other     Social  History Social History   Tobacco Use  . Smoking status: Current Every Day Smoker    Packs/day: 0.75    Types: Cigarettes  . Smokeless tobacco: Never Used  Substance Use Topics  . Alcohol use: Yes    Comment: Once a week.   . Drug use: No    Review of Systems Constitutional: No fever/chills Eyes: No visual changes. ENT: No sore throat. No stiff neck no neck pain Cardiovascular: Denies chest pain. Respiratory: Denies shortness of breath. Gastrointestinal:   no vomiting.  No diarrhea.  No constipation. Genitourinary: Negative for dysuria. Musculoskeletal: Negative lower extremity swelling Skin: Negative for rash. Neurological: Negative for severe headaches, focal weakness or numbness.   ____________________________________________   PHYSICAL EXAM:  VITAL SIGNS: ED Triage Vitals  Enc Vitals Group     BP 04/10/18 1636 (!) 145/99     Pulse Rate 04/10/18 1636 (!) 101     Resp 04/10/18 1636 14     Temp 04/10/18 1636 98 F (36.7 C)     Temp Source 04/10/18 1636 Oral     SpO2 04/10/18 1636 95 %     Weight 04/10/18 1637 140 lb (63.5 kg)     Height --      Head Circumference --      Peak Flow --      Pain Score 04/10/18 1637 7     Pain Loc --  Pain Edu? --      Excl. in GC? --     Constitutional: Alert and oriented. Well appearing and in no acute distress. Eyes: Conjunctivae are normal Head: Atraumatic HEENT: No congestion/rhinnorhea. Mucous membranes are moist.  Oropharynx non-erythematous very slight abrasion to the left side of his tongue not bleeding no swelling Neck:   Nontender with no meningismus, no masses, no stridor Cardiovascular: Normal rate, regular rhythm. Grossly normal heart sounds.  Good peripheral circulation. Respiratory: Normal respiratory effort.  No retractions. Lungs CTAB. Abdominal: Soft and nontender. No distention. No guarding no rebound Back:  There is no focal tenderness or step off.  there is no midline tenderness there are no lesions  noted. there is no CVA tenderness Musculoskeletal: No lower extremity tenderness, no upper extremity tenderness. No joint effusions, no DVT signs strong distal pulses no edema Neurologic:  Normal speech and language. No gross focal neurologic deficits are appreciated.  Skin:  Skin is warm, dry and intact. No rash noted. Psychiatric: Mood and affect are normal. Speech and behavior are normal.  ____________________________________________   LABS (all labs ordered are listed, but only abnormal results are displayed)  Labs Reviewed - No data to display  Pertinent labs  results that were available during my care of the patient were reviewed by me and considered in my medical decision making (see chart for details). ____________________________________________  EKG  I personally interpreted any EKGs ordered by me or triage  ____________________________________________  RADIOLOGY  Pertinent labs & imaging results that were available during my care of the patient were reviewed by me and considered in my medical decision making (see chart for details). If possible, patient and/or family made aware of any abnormal findings.  No results found. ____________________________________________    PROCEDURES  Procedure(s) performed: None  Procedures  Critical Care performed: None  ____________________________________________   INITIAL IMPRESSION / ASSESSMENT AND PLAN / ED COURSE  Pertinent labs & imaging results that were available during my care of the patient were reviewed by me and considered in my medical decision making (see chart for details).  Patient with chronic recurrent seizure disorders declines any work-up in the emergency room.  He does agree to allow me to orally load him, he refuses IV.  He is awake and alert with no evidence of acute injury, I have offered him further work-up but he declines.  I do not think is unreasonable.  Patient states he does actually drive his car  and I have adamantly and strongly advised him not to do so.  I told him that this is a threat to himself and others.  He voices understanding and states he will be compliant with not driving.  We will discuss with pharmacy the best way to load him assuming that he is accurate when he states he has not taken his Dilantin.  I will refill his prescriptions.  He is requesting a work note.    ____________________________________________   FINAL CLINICAL IMPRESSION(S) / ED DIAGNOSES  Final diagnoses:  None      This chart was dictated using voice recognition software.  Despite best efforts to proofread,  errors can occur which can change meaning.      Jeanmarie Plant, MD 04/10/18 1653    Jeanmarie Plant, MD 04/10/18 [phone]

## 2018-04-10 NOTE — Discharge Instructions (Signed)
Do not drink, do not drive, do not soak in the tub, do not climb ladders, do not do anything else which, if interrupted by seizure, could cause you or others harm.  Return to the emergency room for any new or worrisome symptoms.  You have refused blood work and imaging here today which is certainly your choice, it does limit to some extent our ability to evaluate you and if you change your mind or you feel worse would like you to come back.  We also offered you IV loading of Dilantin but you would prefer to do it orally, this does place you at increased risk of seizure, we will give you 300 mg now, we do asked that you take 300 mg after dinner and 300 mg before you go to bed tonight.  We will also refill your prescription.

## 2018-04-10 NOTE — ED Notes (Signed)
Pt advised not to drive per MD.

## 2018-04-10 NOTE — ED Triage Notes (Signed)
Pt presents via EMS s/p seizures x2 per report. Hx of seizures, on Dilantin however out of medication x2 weeks per report. Pt reports head pain s/p fall while having seizure. Postictal on scene, A&Ox4 at this time.

## 2018-05-22 ENCOUNTER — Other Ambulatory Visit: Payer: Self-pay

## 2018-05-22 ENCOUNTER — Encounter (HOSPITAL_COMMUNITY): Payer: Self-pay | Admitting: *Deleted

## 2018-05-22 ENCOUNTER — Emergency Department (HOSPITAL_COMMUNITY): Payer: Self-pay

## 2018-05-22 ENCOUNTER — Emergency Department (HOSPITAL_COMMUNITY)
Admission: EM | Admit: 2018-05-22 | Discharge: 2018-05-22 | Disposition: A | Discharge status: Home / Self Care | Payer: Self-pay | Attending: Emergency Medicine | Admitting: Emergency Medicine

## 2018-05-22 DIAGNOSIS — J45909 Unspecified asthma, uncomplicated: Secondary | ICD-10-CM | POA: Insufficient documentation

## 2018-05-22 DIAGNOSIS — Y9289 Other specified places as the place of occurrence of the external cause: Secondary | ICD-10-CM | POA: Insufficient documentation

## 2018-05-22 DIAGNOSIS — W19XXXA Unspecified fall, initial encounter: Secondary | ICD-10-CM | POA: Insufficient documentation

## 2018-05-22 DIAGNOSIS — Y9389 Activity, other specified: Secondary | ICD-10-CM | POA: Insufficient documentation

## 2018-05-22 DIAGNOSIS — S01511A Laceration without foreign body of lip, initial encounter: Secondary | ICD-10-CM | POA: Insufficient documentation

## 2018-05-22 DIAGNOSIS — S025XXB Fracture of tooth (traumatic), initial encounter for open fracture: Secondary | ICD-10-CM | POA: Insufficient documentation

## 2018-05-22 DIAGNOSIS — F1490 Cocaine use, unspecified, uncomplicated: Secondary | ICD-10-CM | POA: Insufficient documentation

## 2018-05-22 DIAGNOSIS — R569 Unspecified convulsions: Secondary | ICD-10-CM | POA: Insufficient documentation

## 2018-05-22 DIAGNOSIS — F17210 Nicotine dependence, cigarettes, uncomplicated: Secondary | ICD-10-CM | POA: Insufficient documentation

## 2018-05-22 DIAGNOSIS — Y990 Civilian activity done for income or pay: Secondary | ICD-10-CM | POA: Insufficient documentation

## 2018-05-22 LAB — COMPREHENSIVE METABOLIC PANEL
ALBUMIN: 4.1 g/dL (ref 3.5–5.0)
ALT: 53 U/L — ABNORMAL HIGH (ref 0–44)
ANION GAP: 11 (ref 5–15)
AST: 39 U/L (ref 15–41)
Alkaline Phosphatase: 107 U/L (ref 38–126)
BILIRUBIN TOTAL: 0.7 mg/dL (ref 0.3–1.2)
BUN: 12 mg/dL (ref 6–20)
CO2: 23 mmol/L (ref 22–32)
Calcium: 9.1 mg/dL (ref 8.9–10.3)
Chloride: 103 mmol/L (ref 98–111)
Creatinine, Ser: 1.02 mg/dL (ref 0.61–1.24)
GFR calc Af Amer: >60 mL/min (ref >60)
GFR calc non Af Amer: >60 mL/min (ref >60)
GLUCOSE: 87 mg/dL (ref 70–99)
POTASSIUM: 3.9 mmol/L (ref 3.5–5.1)
SODIUM: 137 mmol/L (ref 135–145)
TOTAL PROTEIN: 7.7 g/dL (ref 6.5–8.1)

## 2018-05-22 LAB — URINALYSIS, ROUTINE W REFLEX MICROSCOPIC
Bilirubin Urine: NEGATIVE
GLUCOSE, UA: NEGATIVE mg/dL
Ketones, ur: NEGATIVE mg/dL
Leukocytes, UA: NEGATIVE
Nitrite: NEGATIVE
PH: 5.5 (ref 5.0–8.0)
PROTEIN: 30 mg/dL — AB
Specific Gravity, Urine: >1.03 — ABNORMAL HIGH (ref 1.005–1.030)

## 2018-05-22 LAB — CBC
HCT: 47.3 % (ref 39.0–52.0)
Hemoglobin: 16.4 g/dL (ref 13.0–17.0)
MCH: 31.8 pg (ref 26.0–34.0)
MCHC: 34.7 g/dL (ref 30.0–36.0)
MCV: 91.7 fL (ref 80.0–100.0)
Platelets: 242 10*3/uL (ref 150–400)
RBC: 5.16 MIL/uL (ref 4.22–5.81)
RDW: 12.8 % (ref 11.5–15.5)
WBC: 12.6 10*3/uL — AB (ref 4.0–10.5)
nRBC: 0 % (ref 0.0–0.2)

## 2018-05-22 LAB — PHENYTOIN LEVEL, TOTAL: Phenytoin Lvl: 10.8 ug/mL (ref 10.0–20.0)

## 2018-05-22 LAB — RAPID URINE DRUG SCREEN, HOSP PERFORMED
Amphetamines: NOT DETECTED
Barbiturates: NOT DETECTED
Benzodiazepines: NOT DETECTED
Cocaine: POSITIVE — AB
Opiates: NOT DETECTED
Tetrahydrocannabinol: NOT DETECTED

## 2018-05-22 LAB — URINALYSIS, MICROSCOPIC (REFLEX)
Squamous Epithelial / HPF: NONE SEEN (ref 0–5)
WBC, UA: NONE SEEN WBC/hpf (ref 0–5)

## 2018-05-22 MED ORDER — LIDOCAINE HCL URETHRAL/MUCOSAL 2 % EX GEL
1.0000 "application " | Freq: Once | CUTANEOUS | Status: AC
  Administered 2018-05-22: 1 via TOPICAL
  Filled 2018-05-22: qty 20

## 2018-05-22 MED ORDER — LIDOCAINE HCL (PF) 1 % IJ SOLN: 30.0000 mL | Freq: Once | INTRAMUSCULAR | Status: DC

## 2018-05-22 MED ORDER — LORAZEPAM 1 MG PO TABS
1.0000 mg | ORAL_TABLET | Freq: Once | ORAL | Status: AC
  Administered 2018-05-22: 1 mg via ORAL
  Filled 2018-05-22: qty 1

## 2018-05-22 MED ORDER — BUPIVACAINE HCL 0.25 % IJ SOLN
20.0000 mL | Freq: Once | INTRAMUSCULAR | Status: AC
  Administered 2018-05-22: 20 mL
  Filled 2018-05-22: qty 20

## 2018-05-22 MED ORDER — PHENYTOIN SODIUM EXTENDED 100 MG PO CAPS: 300.0000 mg | ORAL_CAPSULE | Freq: Three times a day (TID) | ORAL | 2 refills | Status: DC

## 2018-05-22 NOTE — ED Notes (Signed)
Pt aware we need urine and advised he had just went to restroom but would try to give sample when able.

## 2018-05-22 NOTE — ED Triage Notes (Signed)
Pt took 400mg  of Dilantin at 1300 because he had a bad feeling . Pt normal dose of Dilantin is 300mg  TID. EMS reported Pt was postictal on arrival To scene . Pt A/O  On arrival to ED. Pt has lac tio upper and lower lip. Bleeding controlled on arrival.

## 2018-05-22 NOTE — Discharge Instructions (Addendum)
You were evaluated in the Emergency Department and after careful evaluation, we did not find any emergent condition requiring admission or further testing in the hospital.  Your symptoms today seem to be due to a seizure causing a ground-level fall.  Very important to get enough sleep and avoid drugs or alcohol to prevent seizures given your seizure history.  Your lip was sutured with absorbable sutures that do not need to be removed.  Your tooth was broken and needs very prompt follow-up with a specialist.  Please call Dr. Chales Salmonwsley at 8 AM Monday morning to be seen Monday in clinic.  Please return to the Emergency Department if you experience any worsening of your condition.  We encourage you to follow up with a primary care provider.  Thank you for allowing us to be a part of your care.

## 2018-05-22 NOTE — ED Notes (Signed)
Patient verbalizes understanding of discharge instructions. Opportunity for questioning and answers were provided. Armband removed by staff, pt discharged from ED in wheelchair with friends/family.

## 2018-05-22 NOTE — ED Provider Notes (Signed)
[facility] Provider Note MRN:  [id number]  Arrival date & time: 05/22/18     Chief Complaint   Seizures   History of Present Illness   Bruce GaribaldiBrandon D Galvan is a 32 y.o. year-old male with a history of seizure disorder, asthma presenting to the ED with chief complaint of seizure, facial trauma.  Patient went to work today at about 2 PM and shortly after felt an aura, described as a blacking out sensation of his vision and feeling a loss of control of his whole body for only a moment.  Took an extra dose of his Dilantin, which he explains he has been taking normally.  No recent fever, no drug use, did not sleep well last night.  Had a full tonic-clonic seizure shortly after at about 3 PM, which caused him to fall forward, trauma to the face.  Seizure lasted a few minutes.  Given fentanyl by EMS.  Currently complaining of pain to the face, teeth, lip, denies headache, no vision change, no nausea or vomiting, no neck pain, no chest pain or shortness of breath, no abdominal pain.  Review of Systems  A complete 10 system review of systems was obtained and all systems are negative except as noted in the HPI and PMH.   Patient's Health History    Past Medical History:  Diagnosis Date  . Asthma   . Seizure [facility](HCC)     History reviewed. No pertinent surgical history.  Family History  Problem Relation Age of Onset  . Cancer Other     Social History   Socioeconomic History  . Marital status: Single    Spouse name: Not on file  . Number of children: Not on file  . Years of education: Not on file  . Highest education level: Not on file  Occupational History  . Not on file  Social Needs  . Financial resource strain: Not on file  . Food insecurity:    Worry: Not on file    Inability: Not on file  . Transportation needs:    Medical: Not on file    Non-medical: Not on file  Tobacco Use  . Smoking status: Current Every Day Smoker    Packs/day: 0.75   Types: Cigarettes  . Smokeless tobacco: Never Used  Substance and Sexual Activity  . Alcohol use: Yes    Comment: Once a week.   . Drug use: No  . Sexual activity: Yes  Lifestyle  . Physical activity:    Days per week: Not on file    Minutes per session: Not on file  . Stress: Not on file  Relationships  . Social connections:    Talks on phone: Not on file    Gets together: Not on file    Attends religious service: Not on file    Active member of club or organization: Not on file    Attends meetings of clubs or organizations: Not on file    Relationship status: Not on file  . Intimate partner violence:    Fear of current or ex partner: Not on file    Emotionally abused: Not on file    Physically abused: Not on file    Forced sexual activity: Not on file  Other Topics Concern  . Not on file  Social History Narrative  . Not on file     Physical Exam  Vital Signs and Nursing Notes reviewed Vitals:   05/22/18 1610 05/22/18 1922  BP: 130/90 128/83  Pulse: 87 77  Resp: 18 16  Temp: 98.1 F (36.7 C)   SpO2: 98% 99%    CONSTITUTIONAL:  well-appearing, NAD NEURO:  Alert and oriented x 3, no focal deficits EYES:  eyes equal and reactive ENT/NECK:  no LAD, no JVD; Ellis 3 fracture to tooth #9 CARDIO:  regular rate, well-perfused, normal S1 and S2 PULM:  CTAB no wheezing or rhonchi GI/GU:  normal bowel sounds, non-distended, non-tender MSK/SPINE:  No gross deformities, no edema SKIN:  no rash; laceration to the upper left lip through the vermilion and wet dry border [facility]:  Appropriate speech and behavior  Diagnostic and Interventional Summary    Labs Reviewed  CBC - Abnormal; Notable for the following components:      Result Value   WBC 12.6 (*)    All other components within normal limits  COMPREHENSIVE METABOLIC PANEL - Abnormal; Notable for the following components:   ALT 53 (*)    All other components within normal limits  URINALYSIS, ROUTINE W REFLEX  MICROSCOPIC - Abnormal; Notable for the following components:   Specific Gravity, Urine >1.030 (*)    Hgb urine dipstick TRACE (*)    Protein, ur 30 (*)    All other components within normal limits  RAPID URINE DRUG SCREEN, HOSP PERFORMED - Abnormal; Notable for the following components:   Cocaine POSITIVE (*)    All other components within normal limits  URINALYSIS, MICROSCOPIC (REFLEX) - Abnormal; Notable for the following components:   Bacteria, UA RARE (*)    All other components within normal limits  PHENYTOIN LEVEL, TOTAL    CT Maxillofacial Wo Contrast  Final Result      Medications  lidocaine (XYLOCAINE) 2 % jelly 1 application (1 application Topical Given 05/22/18 1644)  bupivacaine (MARCAINE) 0.25 % (with pres) injection 20 mL (20 mLs Infiltration Given by Other 05/22/18 1930)  LORazepam (ATIVAN) tablet 1 mg (1 mg Oral Given 05/22/18 1643)     .Marland KitchenLaceration Repair Date/Time: 05/22/2018 7:02 PM Performed by: Sabas Sous, MD Authorized by: Sabas Sous, MD   Consent:    Consent obtained:  Verbal   Consent given by:  Patient Anesthesia (see MAR for exact dosages):    Anesthesia method:  Topical application and local infiltration   Topical anesthetic:  Lidocaine gel   Local anesthetic:  Lidocaine 1% w/o epi Laceration details:    Location:  Lip   Lip location:  Upper exterior lip and upper interior lip   Length (cm):  3   Depth (mm):  3 Repair type:    Repair type:  Simple Pre-procedure details:    Preparation:  Patient was prepped and draped in usual sterile fashion Exploration:    Hemostasis achieved with:  Direct pressure   Wound exploration: entire depth of wound probed and visualized     Contaminated: no   Treatment:    Area cleansed with:  Saline   Amount of cleaning:  Standard Skin repair:    Repair method:  Sutures   Suture size:  5-0   Wound skin closure material used: vicryl.   Suture technique:  Simple interrupted   Number of sutures:   5 Approximation:    Approximation:  Close   Vermilion border: well-aligned   Post-procedure details:    Dressing:  Open (no dressing)   Patient tolerance of procedure:  Tolerated well, no immediate complications    Critical Care  ED Course and Medical Decision Making  I have reviewed the triage  vital signs and the nursing notes.  Pertinent labs & imaging results that were available during my care of the patient were reviewed by me and considered in my medical decision making (see below for details).  Seizure in this 32 year old male with known seizure disorder, unclear trigger of seizure, no illness lately, states compliance with medication, did not sleep well last night.  Work-up pending.  Patient has no neck pain, normal range of motion, cleared clinically.  Normal neurological exam, no headache, no nausea no vomiting, no indication for CNS imaging.  Moderate tenderness to mid face, will CT maxillary to exclude fracture.  Patient will need repair of lip laceration as well as patching of Ellis 3 fracture of tooth 9, will attempt to discuss with maxillofacial surgeon on call to set up prompt follow-up.  CT unremarkable, labs unremarkable, urine positive for cocaine, likely a trigger for seizure.  Lip repaired as described above, calcium hydroxide paste was placed on the tooth fracture.  Discussed case with Dr. Natalia Leatherwoodusley of maxillofacial surgery, will see him in clinic Monday morning.  After the discussed management above, the patient was determined to be safe for discharge.  The patient was in agreement with this plan and all questions regarding their care were answered.  ED return precautions were discussed and the patient will return to the ED with any significant worsening of condition.  Elmer SowMichael M. Pilar PlateBero, MD [facility] [email or web address]  Final Clinical Impressions(s) / ED Diagnoses     ICD-10-CM   1. Seizure (HCC) R56.9   2. Lip  laceration, initial encounter S01.511A   3. Open fracture of tooth, initial encounter [id number]     ED Discharge Orders         Ordered    phenytoin (DILANTIN) 100 MG ER capsule  3 times daily     05/22/18 2010             Sabas SousBero, Eleaner Dibartolo M, MD 05/22/18 2014

## 2018-08-19 ENCOUNTER — Other Ambulatory Visit: Payer: Self-pay

## 2018-08-19 ENCOUNTER — Emergency Department (HOSPITAL_COMMUNITY)
Admission: EM | Admit: 2018-08-19 | Discharge: 2018-08-19 | Disposition: A | Discharge status: Home / Self Care | Payer: Self-pay | Attending: Emergency Medicine | Admitting: Emergency Medicine

## 2018-08-19 ENCOUNTER — Encounter (HOSPITAL_COMMUNITY): Payer: Self-pay | Admitting: Emergency Medicine

## 2018-08-19 DIAGNOSIS — Z79899 Other long term (current) drug therapy: Secondary | ICD-10-CM | POA: Insufficient documentation

## 2018-08-19 DIAGNOSIS — F17210 Nicotine dependence, cigarettes, uncomplicated: Secondary | ICD-10-CM | POA: Insufficient documentation

## 2018-08-19 DIAGNOSIS — J45909 Unspecified asthma, uncomplicated: Secondary | ICD-10-CM | POA: Insufficient documentation

## 2018-08-19 DIAGNOSIS — R569 Unspecified convulsions: Secondary | ICD-10-CM | POA: Insufficient documentation

## 2018-08-19 MED ORDER — PHENYTOIN SODIUM EXTENDED 100 MG PO CAPS
300.0000 mg | ORAL_CAPSULE | Freq: Once | ORAL | Status: AC
  Administered 2018-08-19: 300 mg via ORAL
  Filled 2018-08-19: qty 3

## 2018-08-19 MED ORDER — SODIUM CHLORIDE 0.9 % IV SOLN
1000.0000 mg | Freq: Once | INTRAVENOUS | Status: DC
  Filled 2018-08-19: qty 20

## 2018-08-19 NOTE — Discharge Instructions (Addendum)
Follow-up at the clinic.  Do not drive a car until cleared by a physician.  Keep your Dilantin prescription filled today

## 2018-08-19 NOTE — ED Notes (Signed)
Bed: WA01 Expected date:  Expected time:  Means of arrival:  Comments: EMS-seizure 

## 2018-08-19 NOTE — ED Provider Notes (Addendum)
[facility]   CSN: [id number] Arrival date & time: 08/19/18  1841    History   Chief Complaint Chief Complaint  Patient presents with  . Seizures    HPI Bruce Galvan is a 33 y.o. male.     33 year old male with history of seizure disorder who presents after witnessed seizure at work today.  Patient has history of noncompliance and did bite his tongue today but did not lose any bladder control.  Is unsure of what could have caused a seizure today.  He denies any recent use of alcohol or drugs.  Was postictal when EMS arrived there and blood sugar was above 100.  He is currently back to his baseline.     Past Medical History:  Diagnosis Date  . Asthma   . Seizure (HCC)     There are no active problems to display for this patient.   History reviewed. No pertinent surgical history.      Home Medications    Prior to Admission medications   Medication Sig Start Date End Date Taking? Authorizing Provider  phenytoin (DILANTIN) 100 MG ER capsule Take 3 capsules (300 mg total) by mouth 3 (three) times daily. 05/22/18  Yes Sabas Sous, MD  albuterol (PROVENTIL HFA;VENTOLIN HFA) 108 (90 BASE) MCG/ACT inhaler Inhale 1-2 puffs into the lungs every 6 (six) hours as needed for wheezing or shortness of breath. Patient not taking: Reported on 08/19/2018 01/17/15   Joycie Peek, PA-C  phenytoin (DILANTIN) 100 MG ER capsule Take 3 capsules (300 mg total) by mouth 3 (three) times daily. Patient not taking: Reported on 08/19/2018 04/27/16   Melene Plan, DO  phenytoin (DILANTIN) 100 MG ER capsule Take 3 capsules (300 mg total) by mouth daily. 04/10/18 05/10/18  Jeanmarie Plant, MD    Family History Family History  Problem Relation Age of Onset  . Cancer Other     Social History Social History   Tobacco Use  . Smoking status: Current Every Day Smoker    Packs/day: 0.75    Types: Cigarettes  . Smokeless tobacco: Never Used   Substance Use Topics  . Alcohol use: Yes    Comment: Once a week.   . Drug use: No     Allergies   Patient has no known allergies.   Review of Systems Review of Systems  All other systems reviewed and are negative.    Physical Exam Updated Vital Signs BP 128/80 (BP Location: Left Arm)   Pulse 99   Temp (!) 97.3 F (36.3 C) (Oral)   Resp 18   Ht 1.727 m (5\' 8" )   Wt 63.5 kg   SpO2 96%   BMI 21.29 kg/m   Physical Exam Vitals signs and nursing note reviewed.  Constitutional:      General: He is not in acute distress.    Appearance: Normal appearance. He is well-developed. He is not toxic-appearing.  HENT:     Head: Normocephalic and atraumatic.  Eyes:     General: Lids are normal.     Conjunctiva/sclera: Conjunctivae normal.     Pupils: Pupils are equal, round, and reactive to light.  Neck:     Musculoskeletal: Normal range of motion and neck supple.     Thyroid: No thyroid mass.     Trachea: No tracheal deviation.  Cardiovascular:     Rate and Rhythm: Normal rate and regular rhythm.     Heart sounds: Normal heart sounds. No murmur.  No gallop.   Pulmonary:     Effort: Pulmonary effort is normal. No respiratory distress.     Breath sounds: Normal breath sounds. No stridor. No decreased breath sounds, wheezing, rhonchi or rales.  Abdominal:     General: Bowel sounds are normal. There is no distension.     Palpations: Abdomen is soft.     Tenderness: There is no abdominal tenderness. There is no rebound.  Musculoskeletal: Normal range of motion.        General: No tenderness.  Skin:    General: Skin is warm and dry.     Findings: No abrasion or rash.  Neurological:     Mental Status: He is alert and oriented to person, place, and time.     GCS: GCS eye subscore is 4. GCS verbal subscore is 5. GCS motor subscore is 6.     Cranial Nerves: No cranial nerve deficit.     Sensory: No sensory deficit.     Motor: No weakness or tremor.     Coordination:  Finger-Nose-Finger Test normal.  Psychiatric:        Speech: Speech normal.        Behavior: Behavior normal.      ED Treatments / Results  Labs (all labs ordered are listed, but only abnormal results are displayed) Labs Reviewed - No data to display  EKG None  Radiology No results found.  Procedures Procedures (including critical care time)  Medications Ordered in ED Medications  phenytoin (DILANTIN) ER capsule 300 mg (has no administration in time range)     Initial Impression / Assessment and Plan / ED Course  I have reviewed the triage vital signs and the nursing notes.  Pertinent labs & imaging results that were available during my care of the patient were reviewed by me and considered in my medical decision making (see chart for details).        Patient here with seizure likely from medication noncompliance.  Was offered IV phenytoin and has deferred.  Patient has prescription for Dilantin at the pharmacy which he can pick up.  Patient will be given oral medication and discharged with resources to community wellness center  Final Clinical Impressions(s) / ED Diagnoses   Final diagnoses:  None    ED Discharge Orders    None       Lorre Nick, MD 08/19/18 Terence Lux    Lorre Nick, MD 08/19/18 [phone]

## 2018-08-19 NOTE — ED Triage Notes (Signed)
Patient BIB [facility] from work after having a seizure lasting about 30 sec. Pt was caught by another employee and lowered to ground. Pt did bite tongue and upper lip, minimal bleeding controled when EMS arrived. Pt postictal for a few minutes on the ride in, alert and oriented at this time, no complaints. Pt has hx of seizures but has been noncompliant with meds.

## 2020-01-12 ENCOUNTER — Other Ambulatory Visit: Payer: Self-pay

## 2020-01-12 ENCOUNTER — Encounter (HOSPITAL_COMMUNITY): Payer: Self-pay | Admitting: Emergency Medicine

## 2020-01-12 ENCOUNTER — Emergency Department (HOSPITAL_COMMUNITY)
Admission: EM | Admit: 2020-01-12 | Discharge: 2020-01-12 | Disposition: A | Discharge status: Home / Self Care | Payer: BC Managed Care – PPO | Attending: Emergency Medicine | Admitting: Emergency Medicine

## 2020-01-12 ENCOUNTER — Emergency Department (HOSPITAL_COMMUNITY): Payer: BC Managed Care – PPO

## 2020-01-12 DIAGNOSIS — Y939 Activity, unspecified: Secondary | ICD-10-CM | POA: Insufficient documentation

## 2020-01-12 DIAGNOSIS — W1809XA Striking against other object with subsequent fall, initial encounter: Secondary | ICD-10-CM | POA: Insufficient documentation

## 2020-01-12 DIAGNOSIS — S02401A Maxillary fracture, unspecified, initial encounter for closed fracture: Secondary | ICD-10-CM | POA: Diagnosis not present

## 2020-01-12 DIAGNOSIS — S0230XB Fracture of orbital floor, unspecified side, initial encounter for open fracture: Secondary | ICD-10-CM | POA: Diagnosis not present

## 2020-01-12 DIAGNOSIS — Y999 Unspecified external cause status: Secondary | ICD-10-CM | POA: Insufficient documentation

## 2020-01-12 DIAGNOSIS — R569 Unspecified convulsions: Secondary | ICD-10-CM | POA: Diagnosis not present

## 2020-01-12 DIAGNOSIS — J45909 Unspecified asthma, uncomplicated: Secondary | ICD-10-CM | POA: Insufficient documentation

## 2020-01-12 DIAGNOSIS — Y92838 Other recreation area as the place of occurrence of the external cause: Secondary | ICD-10-CM | POA: Diagnosis not present

## 2020-01-12 DIAGNOSIS — F17210 Nicotine dependence, cigarettes, uncomplicated: Secondary | ICD-10-CM | POA: Diagnosis not present

## 2020-01-12 DIAGNOSIS — S0993XA Unspecified injury of face, initial encounter: Secondary | ICD-10-CM | POA: Diagnosis present

## 2020-01-12 DIAGNOSIS — S0081XA Abrasion of other part of head, initial encounter: Secondary | ICD-10-CM | POA: Diagnosis not present

## 2020-01-12 LAB — BASIC METABOLIC PANEL
Anion gap: 9 (ref 5–15)
BUN: 15 mg/dL (ref 6–20)
CO2: 24 mmol/L (ref 22–32)
Calcium: 9.1 mg/dL (ref 8.9–10.3)
Chloride: 103 mmol/L (ref 98–111)
Creatinine, Ser: 0.87 mg/dL (ref 0.61–1.24)
GFR calc Af Amer: >60 mL/min (ref >60)
GFR calc non Af Amer: >60 mL/min (ref >60)
Glucose, Bld: 113 mg/dL — ABNORMAL HIGH (ref 70–99)
Potassium: 3.9 mmol/L (ref 3.5–5.1)
Sodium: 136 mmol/L (ref 135–145)

## 2020-01-12 LAB — CBG MONITORING, ED: Glucose-Capillary: 115 mg/dL — ABNORMAL HIGH (ref 70–99)

## 2020-01-12 LAB — PHENYTOIN LEVEL, TOTAL: Phenytoin Lvl: <2.5 ug/mL — ABNORMAL LOW (ref 10.0–20.0)

## 2020-01-12 MED ORDER — PHENYTOIN SODIUM EXTENDED 100 MG PO CAPS
300.0000 mg | ORAL_CAPSULE | Freq: Once | ORAL | Status: AC
  Administered 2020-01-12: 300 mg via ORAL
  Filled 2020-01-12: qty 3

## 2020-01-12 MED ORDER — PHENYTOIN SODIUM EXTENDED 100 MG PO CAPS: 300.0000 mg | ORAL_CAPSULE | Freq: Every day | ORAL | 0 refills | Status: DC

## 2020-01-12 MED ORDER — IBUPROFEN 600 MG PO TABS: 600.0000 mg | ORAL_TABLET | Freq: Four times a day (QID) | ORAL | 0 refills | Status: AC | PRN

## 2020-01-12 NOTE — ED Provider Notes (Signed)
[facility]-EMERGENCY DEPT Provider Note   CSN: [id number] Arrival date & time: 01/12/20  0302     History Chief Complaint  Patient presents with  . Facial Injury    Bruce Galvan is a 34 y.o. male.  The history is provided by the patient. No language interpreter was used.  Facial Injury    34 year old male significant history of seizures on Dilantin, history of asthma, presenting for evaluation of facial injury.  Patient report approximate 2 days ago he had an unwitnessed seizure at home.  Patient states he was getting ready to go to walk, and then found himself laying on the ground with pain to his face and blood on the ground.  He believes he fell forward and struck his face against the ground.  He subsequently noticed bruising and swelling around his eyes, as well as nosebleed.  He recall feeling confused.  He decided to come to the ER with concerns of facial injury.  At this time he report minimal facial pain.  Pain is described as a soreness throbbing sensation, mild and nonrating at this time.  He denies any pain with eye movement no vision changes.  He is able to breathe through his nose.  He denies any dental pain.  Denies any significant neck pain or pain to his extremities.  He denies any specific triggers to his seizure but think it may be due to lack of sleep.  He denies alcohol or drug use.  He has not had his Dilantin for several months once his prescription ran out.  States his last seizure was in December.  He has not had his Covid vaccination.  Past Medical History:  Diagnosis Date  . Asthma   . Seizure (HCC)     There are no problems to display for this patient.   History reviewed. No pertinent surgical history.     Family History  Problem Relation Age of Onset  . Cancer Other     Social History   Tobacco Use  . Smoking status: Current Every Day Smoker    Packs/day: 0.75    Types: Cigarettes  . Smokeless tobacco: Never Used    Vaping Use  . Vaping Use: Never used  Substance Use Topics  . Alcohol use: Yes    Comment: Once a week.   . Drug use: No    Home Medications Prior to Admission medications   Medication Sig Start Date End Date Taking? Authorizing Provider  albuterol (PROVENTIL HFA;VENTOLIN HFA) 108 (90 BASE) MCG/ACT inhaler Inhale 1-2 puffs into the lungs every 6 (six) hours as needed for wheezing or shortness of breath. Patient not taking: Reported on 08/19/2018 01/17/15   Joycie Peek, PA-C  phenytoin (DILANTIN) 100 MG ER capsule Take 3 capsules (300 mg total) by mouth 3 (three) times daily. Patient not taking: Reported on 08/19/2018 04/27/16   Melene Plan, DO  phenytoin (DILANTIN) 100 MG ER capsule Take 3 capsules (300 mg total) by mouth daily. 04/10/18 05/10/18  Jeanmarie Plant, MD  phenytoin (DILANTIN) 100 MG ER capsule Take 3 capsules (300 mg total) by mouth 3 (three) times daily. 05/22/18   Sabas Sous, MD    Allergies    Patient has no known allergies.  Review of Systems   Review of Systems  All other systems reviewed and are negative.   Physical Exam Updated Vital Signs BP (!) 146/105 (BP Location: Right Arm)   Pulse 96   Temp 98 F (36.7 C) (Oral)  Resp 18   Ht 5\' 9"  (1.753 m)   Wt 66.7 kg   SpO2 97%   BMI 21.71 kg/m   Physical Exam Vitals and nursing note reviewed.  Constitutional:      General: He is not in acute distress.    Appearance: He is well-developed.  HENT:     Head: Normocephalic.     Comments: Orbital ecchymosis noted bilaterally left greater than right.  Left subconjunctiva hemorrhage.  Abrasion to mid forehead and nose.  Nose is leftward deviated without septal hematoma, no hemotympanum no malocclusion and no significant tongue injury.  No significant midface tenderness. Eyes:     Comments: Left subconjunctiva hemorrhage.  No hyphema.  Neck:     Comments: No cervical midline spine tenderness. Cardiovascular:     Rate and Rhythm: Normal rate and  regular rhythm.     Pulses: Normal pulses.     Heart sounds: Normal heart sounds.  Pulmonary:     Effort: Pulmonary effort is normal.     Breath sounds: Normal breath sounds.  Abdominal:     Palpations: Abdomen is soft.     Tenderness: There is no abdominal tenderness.  Musculoskeletal:     Cervical back: Normal range of motion and neck supple. No tenderness.     Comments: 5 out of 5 strength to all 4 extremities  Skin:    Findings: No rash.  Neurological:     Mental Status: He is alert and oriented to person, place, and time.     GCS: GCS eye subscore is 4. GCS verbal subscore is 5. GCS motor subscore is 6.     Cranial Nerves: Cranial nerves are intact.     Motor: Motor function is intact.     Coordination: Coordination is intact.     Gait: Gait is intact.     ED Results / Procedures / Treatments   Labs (all labs ordered are listed, but only abnormal results are displayed) Labs Reviewed  PHENYTOIN LEVEL, TOTAL - Abnormal; Notable for the following components:      Result Value   Phenytoin Lvl <2.5 (*)    All other components within normal limits  BASIC METABOLIC PANEL - Abnormal; Notable for the following components:   Glucose, Bld 113 (*)    All other components within normal limits  CBG MONITORING, ED - Abnormal; Notable for the following components:   Glucose-Capillary 115 (*)    All other components within normal limits    EKG None  Radiology CT Head Wo Contrast  Result Date: 01/12/2020 CLINICAL DATA:  Seizure, acute, history of trauma. Facial trauma. Additional provided: Patient reports seizure 01/10/2020, hitting face, multiple areas of bruising. EXAM: CT HEAD WITHOUT CONTRAST CT MAXILLOFACIAL WITHOUT CONTRAST TECHNIQUE: Multidetector CT imaging of the head and maxillofacial structures were performed using the standard protocol without intravenous contrast. Multiplanar CT image reconstructions of the maxillofacial structures were also generated. COMPARISON:   Maxillofacial CT 05/22/2018, head CT 09/14/2014. FINDINGS: CT HEAD FINDINGS Brain: Cerebral volume is normal. There is no acute intracranial hemorrhage. No demarcated cortical infarct. No extra-axial fluid collection. No evidence of intracranial mass. No midline shift. Vascular: No hyperdense vessel. Skull: Normal. Negative for fracture or focal lesion. CT MAXILLOFACIAL FINDINGS Osseous: There is an acute, inferiorly displaced fracture of the left orbital floor. There is herniation of orbital fat into the fracture defect. This fracture may involve the left infraorbital foramen. Additional minimally displaced fracture of the anterior wall of the left maxillary sinus (for instance  as seen on series 4, images 43 and 44). Orbits: There is a slightly globular appearance of left inferior rectus muscle and the left inferior rectus muscle may extend slightly into the fracture defect (series 5, image 18). There is gas within the extraconal left orbit. The globes are normal in size and contour. The extraocular muscles are otherwise symmetric and unremarkable. Sinuses: Partial opacification of the left mastoid air cells. No significant mastoid effusion. Soft tissues: There is extensive partial opacification of the left maxillary sinus with hyperdense material, likely reflecting blood products. IMPRESSION: CT head: 1. No evidence of acute intracranial abnormality. CT maxillofacial: 1. Acute inferiorly displaced left orbital floor fracture. Herniation of orbital fat into the fracture defect. Additionally, there is a slightly globular appearance of left inferior rectus muscle and the left inferior rectus muscle may extend slightly into the fracture defect. Correlate for extraocular muscle entrapment. This fracture may involve the left infraorbital foramen. 2. Additional minimally displaced fracture of the anterior wall the left maxillary sinus. 3. Gas is present within the extraconal left orbit. 4. Left periorbital and left  maxillofacial soft tissue swelling. 5. Extensive partial opacification of the left maxillary sinus, likely reflecting the presence of blood products. Electronically Signed   By: Jackey Loge DO   On: 01/12/2020 07:59   CT Maxillofacial Wo Contrast  Result Date: 01/12/2020 CLINICAL DATA:  Seizure, acute, history of trauma. Facial trauma. Additional provided: Patient reports seizure 01/10/2020, hitting face, multiple areas of bruising. EXAM: CT HEAD WITHOUT CONTRAST CT MAXILLOFACIAL WITHOUT CONTRAST TECHNIQUE: Multidetector CT imaging of the head and maxillofacial structures were performed using the standard protocol without intravenous contrast. Multiplanar CT image reconstructions of the maxillofacial structures were also generated. COMPARISON:  Maxillofacial CT 05/22/2018, head CT 09/14/2014. FINDINGS: CT HEAD FINDINGS Brain: Cerebral volume is normal. There is no acute intracranial hemorrhage. No demarcated cortical infarct. No extra-axial fluid collection. No evidence of intracranial mass. No midline shift. Vascular: No hyperdense vessel. Skull: Normal. Negative for fracture or focal lesion. CT MAXILLOFACIAL FINDINGS Osseous: There is an acute, inferiorly displaced fracture of the left orbital floor. There is herniation of orbital fat into the fracture defect. This fracture may involve the left infraorbital foramen. Additional minimally displaced fracture of the anterior wall of the left maxillary sinus (for instance as seen on series 4, images 43 and 44). Orbits: There is a slightly globular appearance of left inferior rectus muscle and the left inferior rectus muscle may extend slightly into the fracture defect (series 5, image 18). There is gas within the extraconal left orbit. The globes are normal in size and contour. The extraocular muscles are otherwise symmetric and unremarkable. Sinuses: Partial opacification of the left mastoid air cells. No significant mastoid effusion. Soft tissues: There is  extensive partial opacification of the left maxillary sinus with hyperdense material, likely reflecting blood products. IMPRESSION: CT head: 1. No evidence of acute intracranial abnormality. CT maxillofacial: 1. Acute inferiorly displaced left orbital floor fracture. Herniation of orbital fat into the fracture defect. Additionally, there is a slightly globular appearance of left inferior rectus muscle and the left inferior rectus muscle may extend slightly into the fracture defect. Correlate for extraocular muscle entrapment. This fracture may involve the left infraorbital foramen. 2. Additional minimally displaced fracture of the anterior wall the left maxillary sinus. 3. Gas is present within the extraconal left orbit. 4. Left periorbital and left maxillofacial soft tissue swelling. 5. Extensive partial opacification of the left maxillary sinus, likely reflecting the presence of blood  products. Electronically Signed   By: Jackey LogeKyle  Golden DO   On: 01/12/2020 07:59    Procedures Procedures (including critical care time)  Medications Ordered in ED Medications  phenytoin (DILANTIN) ER capsule 300 mg (300 mg Oral Given 01/12/20 [id number])    ED Course  I have reviewed the triage vital signs and the nursing notes.  Pertinent labs & imaging results that were available during my care of the patient were reviewed by me and considered in my medical decision making (see chart for details).    MDM Rules/Calculators/A&P                          BP (!) 153/106 (BP Location: Right Arm)   Pulse 92   Temp 98 F (36.7 C) (Oral)   Resp 18   Ht 5\' 9"  (1.753 m)   Wt 66.7 kg   SpO2 98%   BMI 21.71 kg/m   Final Clinical Impression(s) / ED Diagnoses Final diagnoses:  Orbital floor (blow-out) open fracture, initial encounter (HCC)  Maxillary sinus fracture, closed, initial encounter (HCC)  Forehead abrasion, initial encounter  Seizure (HCC)    Rx / DC Orders ED Discharge Orders         Ordered    phenytoin  (DILANTIN) 100 MG ER capsule  Daily     Discontinue  Reprint     01/12/20 0910    ibuprofen (ADVIL) 600 MG tablet  Every 6 hours PRN     Discontinue  Reprint     01/12/20 0910         7:27 AM Patient with significant history of seizures but currently not on his Dilantin due to lack of insurance.  Had a seizure episode 2 days prior and fell forward struck his face and now presenting with raccoon's eyes, deviated nose to the left with facial abrasions.  No other signs of injury noted.  He is mentating appropriately.  Will obtain head and facial CT.  I offer a Dilantin loading dose patient request no IV.  Will provide his normal Dilantin dose of 300 mg.  8:39 AM Head CT scan is unremarkable, CT of the maxillofacial demonstrate acute inferiorly displaced left orbital floor fracture with herniation of the orbital flat into the fracture defect.  There is a slight globular appearance of the left inferior rectus muscles and left inferior rectus muscle may extend into the fracture defect.  Fortunately, patient has maintained normal extraocular movement therefore I have low suspicion for muscle entrapment.  Additionally there is minimally displaced fracture of the anterior wall of the left maxillary sinus.  Gas is present in the extraconal left orbit.  Extensive partial opacification of left mastoid air sinus reflecting presence of blood products.  Left periorbital and left lateral facial soft tissue swelling was noted as well.  Plan to consult ear nose and throat specialist for close follow-up.  I encourage patient to sleep upright as well as avoid blowing his nose.  8:59 AM Appreciate consultation with on-call ear nose and throat specialist, Dr. Ross MarcusSherwood who have review the CT results and recommend patient to follow-up next week for further care.  Patient would likely benefit from repair of his orbital bones on an outpatient schedule.  Patient voiced understanding and agrees with plan.  I will also  represcribe his Dilantin but strongly encourage patient to follow-up with his neurologist as well.  Pain medication prescribed.  Return precaution discussed.   Fayrene Helperran, Delvonte Berenson, PA-C 01/12/20 [id number]  Bethann Berkshire, MD 01/12/20 1410

## 2020-01-12 NOTE — ED Triage Notes (Signed)
Patient had a seizure on Tuesday. Patient states that he hit his face on something but he does not know what because he was by his self when he had the seizure. Patient states that he has not had dilantin in a while because his prescription ran out and he has not had any insurance.

## 2020-01-12 NOTE — ED Notes (Signed)
Assumed care of pt at this time. Pt resting in stretcher, no s/sx of acute distress at this time.  

## 2020-01-12 NOTE — ED Notes (Signed)
Assumed care of pt at this time. Pt resting in stretcher, Aox4, no s/sx of acute distress at this time.

## 2020-01-12 NOTE — Discharge Instructions (Addendum)
You have been evaluated for your facial injury.  You have suffered an orbital floor fracture.  Please avoid blowing your nose, sneezing, or doing anything that increase the pressure off your face which may worsen your symptoms.  Take ibuprofen as needed for pain.  Continue taking your Dilantin as prescribed and follow-up closely with your neurologist for further care of your seizure.  Please call and follow-up with surgeon Dr. Ross Marcus for an appointment next week.  You may need facial surgery for your broken bones.  Return to the ER if you develop any vision changes, difficulty moving your eye, or if you have any other concern.

## 2020-08-25 ENCOUNTER — Emergency Department (HOSPITAL_COMMUNITY)
Admission: EM | Admit: 2020-08-25 | Discharge: 2020-08-26 | Disposition: A | Discharge status: Home / Self Care | Payer: Self-pay | Attending: Emergency Medicine | Admitting: Emergency Medicine

## 2020-08-25 ENCOUNTER — Emergency Department (HOSPITAL_COMMUNITY): Payer: Self-pay

## 2020-08-25 ENCOUNTER — Encounter (HOSPITAL_COMMUNITY): Payer: Self-pay

## 2020-08-25 ENCOUNTER — Other Ambulatory Visit: Payer: Self-pay

## 2020-08-25 DIAGNOSIS — F17210 Nicotine dependence, cigarettes, uncomplicated: Secondary | ICD-10-CM | POA: Insufficient documentation

## 2020-08-25 DIAGNOSIS — R569 Unspecified convulsions: Secondary | ICD-10-CM | POA: Insufficient documentation

## 2020-08-25 DIAGNOSIS — J45909 Unspecified asthma, uncomplicated: Secondary | ICD-10-CM | POA: Insufficient documentation

## 2020-08-25 DIAGNOSIS — S22080A Wedge compression fracture of T11-T12 vertebra, initial encounter for closed fracture: Secondary | ICD-10-CM | POA: Insufficient documentation

## 2020-08-25 DIAGNOSIS — Y9289 Other specified places as the place of occurrence of the external cause: Secondary | ICD-10-CM | POA: Insufficient documentation

## 2020-08-25 DIAGNOSIS — W19XXXA Unspecified fall, initial encounter: Secondary | ICD-10-CM | POA: Insufficient documentation

## 2020-08-25 DIAGNOSIS — Y990 Civilian activity done for income or pay: Secondary | ICD-10-CM | POA: Insufficient documentation

## 2020-08-25 DIAGNOSIS — R52 Pain, unspecified: Secondary | ICD-10-CM

## 2020-08-25 DIAGNOSIS — S0003XA Contusion of scalp, initial encounter: Secondary | ICD-10-CM | POA: Insufficient documentation

## 2020-08-25 LAB — COMPREHENSIVE METABOLIC PANEL
ALT: 31 U/L (ref 0–44)
AST: 39 U/L (ref 15–41)
Albumin: 4.3 g/dL (ref 3.5–5.0)
Alkaline Phosphatase: 84 U/L (ref 38–126)
Anion gap: 8 (ref 5–15)
BUN: 14 mg/dL (ref 6–20)
CO2: 22 mmol/L (ref 22–32)
Calcium: 9.1 mg/dL (ref 8.9–10.3)
Chloride: 105 mmol/L (ref 98–111)
Creatinine, Ser: 1.28 mg/dL — ABNORMAL HIGH (ref 0.61–1.24)
GFR, Estimated: >60 mL/min (ref >60)
Glucose, Bld: 113 mg/dL — ABNORMAL HIGH (ref 70–99)
Potassium: 4.6 mmol/L (ref 3.5–5.1)
Sodium: 135 mmol/L (ref 135–145)
Total Bilirubin: 1 mg/dL (ref 0.3–1.2)
Total Protein: 8 g/dL (ref 6.5–8.1)

## 2020-08-25 LAB — CBC WITH DIFFERENTIAL/PLATELET
Abs Immature Granulocytes: 0.35 10*3/uL — ABNORMAL HIGH (ref 0.00–0.07)
Basophils Absolute: 0.1 10*3/uL (ref 0.0–0.1)
Basophils Relative: 1 %
Eosinophils Absolute: 0.1 10*3/uL (ref 0.0–0.5)
Eosinophils Relative: 0 %
HCT: 49 % (ref 39.0–52.0)
Hemoglobin: 16.6 g/dL (ref 13.0–17.0)
Immature Granulocytes: 1 %
Lymphocytes Relative: 10 %
Lymphs Abs: 2.5 10*3/uL (ref 0.7–4.0)
MCH: 31.9 pg (ref 26.0–34.0)
MCHC: 33.9 g/dL (ref 30.0–36.0)
MCV: 94 fL (ref 80.0–100.0)
Monocytes Absolute: 1.3 10*3/uL — ABNORMAL HIGH (ref 0.1–1.0)
Monocytes Relative: 6 %
Neutro Abs: 20 10*3/uL — ABNORMAL HIGH (ref 1.7–7.7)
Neutrophils Relative %: 82 %
Platelets: 232 10*3/uL (ref 150–400)
RBC: 5.21 MIL/uL (ref 4.22–5.81)
RDW: 13.2 % (ref 11.5–15.5)
WBC: 24.4 10*3/uL — ABNORMAL HIGH (ref 4.0–10.5)
nRBC: 0 % (ref 0.0–0.2)

## 2020-08-25 MED ORDER — ACETAMINOPHEN 500 MG PO TABS
1000.0000 mg | ORAL_TABLET | Freq: Once | ORAL | Status: AC
  Administered 2020-08-25: 1000 mg via ORAL
  Filled 2020-08-25: qty 2

## 2020-08-25 MED ORDER — HYDROMORPHONE HCL 1 MG/ML IJ SOLN
1.0000 mg | Freq: Once | INTRAMUSCULAR | Status: AC
Start: 2020-08-25 — End: 2020-08-25
  Administered 2020-08-25: 1 mg via INTRAVENOUS
  Filled 2020-08-25: qty 1

## 2020-08-25 MED ORDER — SODIUM CHLORIDE 0.9 % IV SOLN
2000.0000 mg | Freq: Once | INTRAVENOUS | Status: AC
  Administered 2020-08-25: 2000 mg via INTRAVENOUS
  Filled 2020-08-25: qty 20

## 2020-08-25 MED ORDER — SODIUM CHLORIDE 0.9 % IV BOLUS
1000.0000 mL | Freq: Once | INTRAVENOUS | Status: AC
  Administered 2020-08-25: 1000 mL via INTRAVENOUS

## 2020-08-25 MED ORDER — IOHEXOL 300 MG/ML  SOLN
100.0000 mL | Freq: Once | INTRAMUSCULAR | Status: AC | PRN
  Administered 2020-08-25: 100 mL via INTRAVENOUS

## 2020-08-25 NOTE — ED Provider Notes (Signed)
Plan at signout is to follow-up on CT imaging after a seizure.  Patient will be started on Keppra 500 mg twice daily   Zadie Rhine, MD 08/25/20 2312

## 2020-08-25 NOTE — ED Provider Notes (Signed)
[facility]-EMERGENCY DEPT Provider Note   CSN: [id number] Arrival date & time: 08/25/20  2037     History Chief Complaint  Patient presents with  . Seizures    Bruce Galvan is a 35 y.o. male.  HPI 35 year old male presents with a seizure at work. History is from patient and from nurse who spoke to EMS. Patient was at work and had a seizure.  He has been out of his Dilantin for at least 2 months.  Patient fell backwards and has been having back pain since he woke up.  No headache, neck pain or other acute complaints.  He states often he will get some muscular pain after a seizure.  Last time he saw a neurologist was almost 20 years ago.  He drinks alcohol but not daily.  He denies illicit drug use.  No recent illness.  States he just got a job and so does not have insurance and has not been able to afford Dilantin.  Past Medical History:  Diagnosis Date  . Asthma   . Seizure (HCC)     There are no problems to display for this patient.   No past surgical history on file.     Family History  Problem Relation Age of Onset  . Cancer Other     Social History   Tobacco Use  . Smoking status: Current Every Day Smoker    Packs/day: 0.75    Types: Cigarettes  . Smokeless tobacco: Never Used  Vaping Use  . Vaping Use: Never used  Substance Use Topics  . Alcohol use: Yes    Comment: Once a week.   . Drug use: No    Home Medications Prior to Admission medications   Medication Sig Start Date End Date Taking? Authorizing Provider  ibuprofen (ADVIL) 600 MG tablet Take 1 tablet (600 mg total) by mouth every 6 (six) hours as needed. 01/12/20   Fayrene Helper, PA-C  phenytoin (DILANTIN) 100 MG ER capsule Take 3 capsules (300 mg total) by mouth daily. 01/12/20 02/11/20  Fayrene Helper, PA-C  albuterol (PROVENTIL HFA;VENTOLIN HFA) 108 (90 BASE) MCG/ACT inhaler Inhale 1-2 puffs into the lungs every 6 (six) hours as needed for wheezing or shortness of breath. Patient  not taking: Reported on 08/19/2018 01/17/15 01/12/20  Joycie Peek, PA-C    Allergies    Patient has no known allergies.  Review of Systems   Review of Systems  Constitutional: Negative for fever.  Gastrointestinal: Negative for vomiting.  Musculoskeletal: Positive for back pain.  Neurological: Negative for weakness, numbness and headaches.  All other systems reviewed and are negative.   Physical Exam Updated Vital Signs BP (!) 137/105 (BP Location: Right Arm)   Pulse 91   Temp 98.3 F (36.8 C) (Oral)   Resp 15   SpO2 94%   Physical Exam Vitals and nursing note reviewed.  Constitutional:      Appearance: He is well-developed.     Interventions: Cervical collar in place.  HENT:     Head: Normocephalic. Contusion present.      Right Ear: External ear normal.     Left Ear: External ear normal.     Nose: Nose normal.  Eyes:     General:        Right eye: No discharge.        Left eye: No discharge.     Extraocular Movements: Extraocular movements intact.     Pupils: Pupils are equal, round, and reactive  to light.  Cardiovascular:     Rate and Rhythm: Normal rate and regular rhythm.     Heart sounds: Normal heart sounds.  Pulmonary:     Effort: Pulmonary effort is normal.     Breath sounds: Normal breath sounds.  Abdominal:     Palpations: Abdomen is soft.     Tenderness: There is no abdominal tenderness.  Musculoskeletal:     Cervical back: Normal range of motion and neck supple. No rigidity.     Comments: No focal back tenderness. He reports the pain is lower thoracic/upper lumbar  Skin:    General: Skin is warm and dry.  Neurological:     Mental Status: He is alert and oriented to person, place, and time.     Comments: CN 3-12 grossly intact. 5/5 strength in all 4 extremities. Grossly normal sensation. Normal finger to nose.   Psychiatric:        Mood and Affect: Mood is not anxious.     ED Results / Procedures / Treatments   Labs (all labs ordered are  listed, but only abnormal results are displayed) Labs Reviewed  CBC WITH DIFFERENTIAL/PLATELET  COMPREHENSIVE METABOLIC PANEL  URINALYSIS, ROUTINE W REFLEX MICROSCOPIC    EKG None  Radiology No results found.  Procedures Procedures   Medications Ordered in ED Medications  acetaminophen (TYLENOL) tablet 1,000 mg (has no administration in time range)  levETIRAcetam (KEPPRA) 2,000 mg in sodium chloride 0.9 % 250 mL IVPB (has no administration in time range)    ED Course  I have reviewed the triage vital signs and the nursing notes.  Pertinent labs & imaging results that were available during my care of the patient were reviewed by me and considered in my medical decision making (see chart for details).    MDM Rules/Calculators/A&P                          Patient's xrays show T12 compression fracture. This correlates with where he is indicating his pain is. No chest/upper back pain. Does have some bruising to left scalp near ear. Now he's having abdominal discomfort. Given he had enough trauma to break his back with his seizure, will get CT head, c-spine and abd pelvis.   From a seizure standpoint, I did discuss with Dr. Wilford Corner as patient states the dilantin is too expensive. Would be reasonable to switch to Keppra (patient wonders if this gave him side effects in the past). Given 2 g IV load now and if discharged, give 500 mg BID (would be $9 at [facility]).  CTs are currently pending. Care to Dr. Bebe Shaggy. Final Clinical Impression(s) / ED Diagnoses Final diagnoses:  Seizure [facility] [place])    Rx / DC Orders ED Discharge Orders    None       Pricilla Loveless, MD 08/25/20 2313

## 2020-08-25 NOTE — ED Notes (Signed)
Patient transported to X-ray 

## 2020-08-25 NOTE — ED Triage Notes (Signed)
Pt came in with c/o seizure at [facility] while working. Pt states that he has been out of his Dilantin for two months. This has been his first seizure since. Pt fell from a standing position while having a seizure and hit his back and head. Pt has bruising to his L ear. Pt A=O times four

## 2020-08-26 ENCOUNTER — Emergency Department (HOSPITAL_COMMUNITY): Payer: Self-pay

## 2020-08-26 MED ORDER — LEVETIRACETAM 500 MG PO TABS: 500.0000 mg | ORAL_TABLET | Freq: Two times a day (BID) | ORAL | 0 refills | Status: AC

## 2020-08-26 MED ORDER — FENTANYL CITRATE (PF) 100 MCG/2ML IJ SOLN
50.0000 ug | Freq: Once | INTRAMUSCULAR | Status: AC
  Administered 2020-08-26: 50 ug via INTRAVENOUS
  Filled 2020-08-26: qty 2

## 2020-08-26 MED ORDER — OXYCODONE-ACETAMINOPHEN 5-325 MG PO TABS: 1.0000 | ORAL_TABLET | Freq: Three times a day (TID) | ORAL | 0 refills | Status: AC | PRN

## 2020-08-26 NOTE — ED Notes (Signed)
TLSO order has be placed by ortho tech at [facility]

## 2020-08-26 NOTE — Discharge Instructions (Addendum)
Please be aware you may have another seizure ° °Do not drive until seen by your physician for your condition ° °Do not climb ladders/roofs/trees as a seizure can occur at that height and cause serious harm ° °Do not bathe/swim alone as a seizure can occur and cause serious harm ° °Please followup with your physician or neurologist for further testing and possible treatment ° ° °

## 2020-08-26 NOTE — ED Provider Notes (Signed)
CT imaging reveals T12 incomplete burst fracture. Patient has no neurologic complaints.  He has no focal weakness.  He is resting comfortably Awake/alert,equal distal motor 5/5 strength noted with the following: hip flexion/knee flexion/extension, ankle dorsi/plantar flexion, great toe extension intact bilaterally,  no sensory deficit in any dermatome.    Discussed the case with neurosurgery on-call PA.  He recommends TLSO brace, repeat thoracic x-rays while standing with brace in place, and if no instability patient can be discharged home with pain medications and follow-up with neurosurgery in 3-4 weeks.  Patient will also need neurology follow-up.  Keppra and pain medications have been sent to his pharmacy.  Patient has been instructed not to drive.   Zadie Rhine, MD 08/26/20 [phone]

## 2020-08-26 NOTE — Progress Notes (Signed)
Orthopedic Tech Progress Note Patient Details:  PHU RECORD [birth year] [id number]  Patient ID: Bruce Galvan, male   DOB: September 15, 1985, 35 y.o.   MRN: [id number] Called order into [facility].  Trinna Post 08/26/2020, 12:45 AM

## 2020-09-19 ENCOUNTER — Other Ambulatory Visit: Payer: Self-pay

## 2020-09-19 ENCOUNTER — Emergency Department (HOSPITAL_COMMUNITY)
Admission: EM | Admit: 2020-09-19 | Discharge: 2020-09-19 | Disposition: A | Discharge status: Home / Self Care | Payer: Self-pay | Attending: Emergency Medicine | Admitting: Emergency Medicine

## 2020-09-19 DIAGNOSIS — R569 Unspecified convulsions: Secondary | ICD-10-CM

## 2020-09-19 DIAGNOSIS — Z79899 Other long term (current) drug therapy: Secondary | ICD-10-CM | POA: Insufficient documentation

## 2020-09-19 DIAGNOSIS — S00512A Abrasion of oral cavity, initial encounter: Secondary | ICD-10-CM | POA: Insufficient documentation

## 2020-09-19 DIAGNOSIS — X58XXXA Exposure to other specified factors, initial encounter: Secondary | ICD-10-CM | POA: Insufficient documentation

## 2020-09-19 DIAGNOSIS — F17210 Nicotine dependence, cigarettes, uncomplicated: Secondary | ICD-10-CM | POA: Insufficient documentation

## 2020-09-19 DIAGNOSIS — G40909 Epilepsy, unspecified, not intractable, without status epilepticus: Secondary | ICD-10-CM | POA: Insufficient documentation

## 2020-09-19 DIAGNOSIS — J45909 Unspecified asthma, uncomplicated: Secondary | ICD-10-CM | POA: Insufficient documentation

## 2020-09-19 MED ORDER — PHENYTOIN SODIUM EXTENDED 100 MG PO CAPS: 100.0000 mg | ORAL_CAPSULE | Freq: Three times a day (TID) | ORAL | 0 refills | Status: DC

## 2020-09-19 NOTE — ED Triage Notes (Signed)
Pt BIBA from work d/t witnessed seizure. EMS reports pt postictal when they arrived to scene, pt arrives to ED AOx4.  Pt reports recently being seen in ED for seizures and prescribed keppra, to which made him "feel funny" so he hasnt taken it.   Pt with laceration to left superior aspect of tongue.

## 2020-09-19 NOTE — ED Provider Notes (Signed)
[facility]-EMERGENCY DEPT Provider Note   CSN: [id number] Arrival date & time: 09/19/20  1238     History Chief Complaint  Patient presents with  . Seizures    Bruce Galvan is a 35 y.o. male.  Pt presents to the ED today with a seizure.  He has a hx of seizures.  He had been on dilantin, but this was changed to keppra.  The keppra made him feel bad, so he stopped taking it.  He did sustain a tongue abrasion and was post-ictal when EMS arrived.  He is now awake and alert and does not want to stay.  He wants a rx for dilantin.        Past Medical History:  Diagnosis Date  . Asthma   . Seizure (HCC)     There are no problems to display for this patient.   No past surgical history on file.     Family History  Problem Relation Age of Onset  . Cancer Other     Social History   Tobacco Use  . Smoking status: Current Every Day Smoker    Packs/day: 0.75    Types: Cigarettes  . Smokeless tobacco: Never Used  Vaping Use  . Vaping Use: Never used  Substance Use Topics  . Alcohol use: Yes    Comment: Once a week.   . Drug use: No    Home Medications Prior to Admission medications   Medication Sig Start Date End Date Taking? Authorizing Provider  phenytoin (DILANTIN) 100 MG ER capsule Take 1 capsule (100 mg total) by mouth 3 (three) times daily. 09/19/20  Yes Jacalyn Lefevre, MD  ibuprofen (ADVIL) 600 MG tablet Take 1 tablet (600 mg total) by mouth every 6 (six) hours as needed. Patient not taking: No sig reported 01/12/20   Fayrene Helper, PA-C  levETIRAcetam (KEPPRA) 500 MG tablet Take 1 tablet (500 mg total) by mouth 2 (two) times daily. 08/26/20   Zadie Rhine, MD  oxyCODONE-acetaminophen (PERCOCET) 5-325 MG tablet Take 1 tablet by mouth every 8 (eight) hours as needed for severe pain. 08/26/20   Zadie Rhine, MD  albuterol (PROVENTIL HFA;VENTOLIN HFA) 108 (90 BASE) MCG/ACT inhaler Inhale 1-2 puffs into the lungs every 6 (six) hours as  needed for wheezing or shortness of breath. Patient not taking: Reported on 08/19/2018 01/17/15 01/12/20  Joycie Peek, PA-C    Allergies    Patient has no known allergies.  Review of Systems   Review of Systems  HENT:       Tongue abrasion  Neurological: Positive for seizures.  All other systems reviewed and are negative.   Physical Exam Updated Vital Signs BP (!) 150/99 (BP Location: Left Arm)   Pulse 86   Temp 97.9 F (36.6 C) (Oral)   Resp 19   SpO2 93%   Physical Exam Vitals and nursing note reviewed.  Constitutional:      Appearance: Normal appearance.  HENT:     Head: Normocephalic and atraumatic.     Right Ear: External ear normal.     Left Ear: External ear normal.     Nose: Nose normal.     Mouth/Throat:     Mouth: Mucous membranes are moist.     Comments: Tongue abrasion to left tongue Eyes:     Extraocular Movements: Extraocular movements intact.     Conjunctiva/sclera: Conjunctivae normal.     Pupils: Pupils are equal, round, and reactive to light.  Cardiovascular:  Rate and Rhythm: Normal rate and regular rhythm.     Pulses: Normal pulses.     Heart sounds: Normal heart sounds.  Pulmonary:     Effort: Pulmonary effort is normal.     Breath sounds: Normal breath sounds.  Abdominal:     General: Abdomen is flat. Bowel sounds are normal.     Palpations: Abdomen is soft.  Musculoskeletal:        General: Normal range of motion.     Cervical back: Normal range of motion and neck supple.  Skin:    General: Skin is warm.     Capillary Refill: Capillary refill takes less than 2 seconds.  Neurological:     General: No focal deficit present.     Mental Status: He is alert and oriented to person, place, and time.  Psychiatric:        Mood and Affect: Mood normal.        Behavior: Behavior normal.        Thought Content: Thought content normal.        Judgment: Judgment normal.     ED Results / Procedures / Treatments   Labs (all labs  ordered are listed, but only abnormal results are displayed) Labs Reviewed - No data to display  EKG None  Radiology No results found.  Procedures Procedures   Medications Ordered in ED Medications - No data to display  ED Course  I have reviewed the triage vital signs and the nursing notes.  Pertinent labs & imaging results that were available during my care of the patient were reviewed by me and considered in my medical decision making (see chart for details).    MDM Rules/Calculators/A&P                          Pt is awake and alert and wants to go.  I did try to keep him here to give him a dose of IV cerebyx, but he declines.  I have no reason to keep him here against his will.  I did give him a rx for dilantin and told him to f/u with neurology.  He knows to return if worse. Final Clinical Impression(s) / ED Diagnoses Final diagnoses:  Seizure (HCC)    Rx / DC Orders ED Discharge Orders         Ordered    phenytoin (DILANTIN) 100 MG ER capsule  3 times daily        09/19/20 1315           Jacalyn Lefevre, MD 09/19/20 1319

## 2020-12-28 ENCOUNTER — Emergency Department (HOSPITAL_COMMUNITY)
Admission: EM | Admit: 2020-12-28 | Discharge: 2020-12-28 | Disposition: A | Discharge status: Home / Self Care | Payer: Self-pay | Attending: Emergency Medicine | Admitting: Emergency Medicine

## 2020-12-28 ENCOUNTER — Other Ambulatory Visit: Payer: Self-pay

## 2020-12-28 ENCOUNTER — Encounter (HOSPITAL_COMMUNITY): Payer: Self-pay

## 2020-12-28 DIAGNOSIS — Z5321 Procedure and treatment not carried out due to patient leaving prior to being seen by health care provider: Secondary | ICD-10-CM | POA: Insufficient documentation

## 2020-12-28 DIAGNOSIS — R569 Unspecified convulsions: Secondary | ICD-10-CM | POA: Insufficient documentation

## 2020-12-28 NOTE — ED Notes (Signed)
Pt states that he wanted to leave and walked out the front entrance

## 2020-12-28 NOTE — ED Triage Notes (Signed)
Pt BIB EMS from work. Pt had a witnessed seizure at work. Pt was post-ictal prior to EMS arrival. Pt was A&O x4 now. Pt has not taken his seizure medication in 2 weeks.

## 2021-01-29 ENCOUNTER — Emergency Department (HOSPITAL_COMMUNITY)
Admission: EM | Admit: 2021-01-29 | Discharge: 2021-01-29 | Disposition: A | Discharge status: Home / Self Care | Payer: Self-pay | Attending: Emergency Medicine | Admitting: Emergency Medicine

## 2021-01-29 ENCOUNTER — Other Ambulatory Visit: Payer: Self-pay

## 2021-01-29 DIAGNOSIS — F17210 Nicotine dependence, cigarettes, uncomplicated: Secondary | ICD-10-CM | POA: Insufficient documentation

## 2021-01-29 DIAGNOSIS — J45909 Unspecified asthma, uncomplicated: Secondary | ICD-10-CM | POA: Insufficient documentation

## 2021-01-29 DIAGNOSIS — A539 Syphilis, unspecified: Secondary | ICD-10-CM | POA: Insufficient documentation

## 2021-01-29 LAB — CBC WITH DIFFERENTIAL/PLATELET
Abs Immature Granulocytes: 0.07 10*3/uL (ref 0.00–0.07)
Basophils Absolute: 0.1 10*3/uL (ref 0.0–0.1)
Basophils Relative: 1 %
Eosinophils Absolute: 0.6 10*3/uL — ABNORMAL HIGH (ref 0.0–0.5)
Eosinophils Relative: 4 %
HCT: 46.8 % (ref 39.0–52.0)
Hemoglobin: 16.3 g/dL (ref 13.0–17.0)
Immature Granulocytes: 1 %
Lymphocytes Relative: 23 %
Lymphs Abs: 3.2 10*3/uL (ref 0.7–4.0)
MCH: 31.8 pg (ref 26.0–34.0)
MCHC: 34.8 g/dL (ref 30.0–36.0)
MCV: 91.4 fL (ref 80.0–100.0)
Monocytes Absolute: 0.8 10*3/uL (ref 0.1–1.0)
Monocytes Relative: 6 %
Neutro Abs: 9.3 10*3/uL — ABNORMAL HIGH (ref 1.7–7.7)
Neutrophils Relative %: 65 %
Platelets: 197 10*3/uL (ref 150–400)
RBC: 5.12 MIL/uL (ref 4.22–5.81)
RDW: 14.2 % (ref 11.5–15.5)
WBC: 13.9 10*3/uL — ABNORMAL HIGH (ref 4.0–10.5)
nRBC: 0 % (ref 0.0–0.2)

## 2021-01-29 LAB — COMPREHENSIVE METABOLIC PANEL
ALT: 28 U/L (ref 0–44)
AST: 27 U/L (ref 15–41)
Albumin: 3.8 g/dL (ref 3.5–5.0)
Alkaline Phosphatase: 108 U/L (ref 38–126)
Anion gap: 8 (ref 5–15)
BUN: 10 mg/dL (ref 6–20)
CO2: 26 mmol/L (ref 22–32)
Calcium: 9.1 mg/dL (ref 8.9–10.3)
Chloride: 105 mmol/L (ref 98–111)
Creatinine, Ser: 0.91 mg/dL (ref 0.61–1.24)
GFR, Estimated: >60 mL/min (ref >60)
Glucose, Bld: 193 mg/dL — ABNORMAL HIGH (ref 70–99)
Potassium: 3.6 mmol/L (ref 3.5–5.1)
Sodium: 139 mmol/L (ref 135–145)
Total Bilirubin: 0.4 mg/dL (ref 0.3–1.2)
Total Protein: 7.3 g/dL (ref 6.5–8.1)

## 2021-01-29 LAB — RAPID HIV SCREEN (HIV 1/2 AB+AG)
HIV 1/2 Antibodies: NONREACTIVE
HIV-1 P24 Antigen - HIV24: NONREACTIVE

## 2021-01-29 LAB — LIPASE, BLOOD: Lipase: 43 U/L (ref 11–51)

## 2021-01-29 MED ORDER — ONDANSETRON 4 MG PO TBDP: 4.0000 mg | ORAL_TABLET | Freq: Three times a day (TID) | ORAL | 0 refills | Status: AC | PRN

## 2021-01-29 MED ORDER — DOXYCYCLINE HYCLATE 100 MG PO TABS
100.0000 mg | ORAL_TABLET | Freq: Once | ORAL | Status: AC
  Administered 2021-01-29: 100 mg via ORAL
  Filled 2021-01-29: qty 1

## 2021-01-29 MED ORDER — ONDANSETRON 4 MG PO TBDP: 4.0000 mg | ORAL_TABLET | Freq: Three times a day (TID) | ORAL | 0 refills | Status: DC | PRN

## 2021-01-29 MED ORDER — DOXYCYCLINE HYCLATE 100 MG PO CAPS: 100.0000 mg | ORAL_CAPSULE | Freq: Two times a day (BID) | ORAL | 0 refills | Status: AC

## 2021-01-29 MED ORDER — DOXYCYCLINE HYCLATE 100 MG PO CAPS: 100.0000 mg | ORAL_CAPSULE | Freq: Two times a day (BID) | ORAL | 0 refills | Status: DC

## 2021-01-29 NOTE — ED Provider Notes (Signed)
Emergency Medicine Provider Triage Evaluation Note  Bruce Galvan , a 35 y.o. male  was evaluated in triage.  Pt complains of recent genital rash, nausea, vomiting that started a week ago. The rash has now spread all over the patient's body. 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  Review of Systems  Positive: Rash, nausea, vomiting Negative: Diarrhea, chest pain, shortness of breaht  Physical Exam  BP (!) 140/100 (BP Location: Left Arm)   Pulse (!) 108   Temp 98.3 F (36.8 C) (Oral)   Resp 18   Ht 5\' 9"  (1.753 m)   Wt 63.5 kg   SpO2 96%   BMI 20.67 kg/m  Gen:   Awake, no distress   Resp:  Normal effort  MSK:   Moves extremities without difficulty  Other:  Minimal tenderness to palpation of abdomen, pustular rash on all visible skin surfaces  Medical Decision Making  Medically screening exam initiated at 6:04 PM.  Appropriate orders placed.  Bruce Galvan was informed that the remainder of the evaluation will be completed by another provider, this initial triage assessment does not replace that evaluation, and the importance of remaining in the ED until their evaluation is complete.  Rash, genital secretions, nausea, vomiting   Suzan Garibaldi 01/29/21 1806    01/31/21, MD 01/30/21 [phone]

## 2021-01-29 NOTE — ED Provider Notes (Signed)
[facility]-EMERGENCY DEPT Provider Note   CSN: [id number] Arrival date & time: 01/29/21  1744     History Chief Complaint  Patient presents with   Nausea   Rash    Bruce Galvan is a 35 y.o. male.   Rash Associated symptoms: fever and nausea   Associated symptoms: no shortness of breath   Patient presents with rash.  Began around a week ago.  Started in his genital area then went diffusely over his body.  Also on his hands and his feet.  More itchy than painful.  States some the lesions have popped.  States he has had unprotected sex with another male around 3 weeks ago.  States he did have syphilis around 7 years ago.  Treated with shot at that time.  Has not been seen for it since.  States that when he got that shot of penicillin he passed out.  States he has never passed out with a different shot.  States he had some fevers and chills and some nausea and vomiting    Past Medical History:  Diagnosis Date   Asthma    Seizure (HCC)     There are no problems to display for this patient.   No past surgical history on file.     Family History  Problem Relation Age of Onset   Cancer Other     Social History   Tobacco Use   Smoking status: Every Day    Packs/day: 0.75    Types: Cigarettes   Smokeless tobacco: Never  Vaping Use   Vaping Use: Never used  Substance Use Topics   Alcohol use: Yes    Comment: Once a week.    Drug use: No    Home Medications Prior to Admission medications   Medication Sig Start Date End Date Taking? Authorizing Provider  doxycycline (VIBRAMYCIN) 100 MG capsule Take 1 capsule (100 mg total) by mouth 2 (two) times daily. 01/29/21   Benjiman Core, MD  ibuprofen (ADVIL) 600 MG tablet Take 1 tablet (600 mg total) by mouth every 6 (six) hours as needed. Patient not taking: No sig reported 01/12/20   Fayrene Helper, PA-C  levETIRAcetam (KEPPRA) 500 MG tablet Take 1 tablet (500 mg total) by mouth 2 (two) times daily.  08/26/20   Zadie Rhine, MD  ondansetron (ZOFRAN-ODT) 4 MG disintegrating tablet Take 1 tablet (4 mg total) by mouth every 8 (eight) hours as needed for nausea or vomiting. 01/29/21   Benjiman Core, MD  oxyCODONE-acetaminophen (PERCOCET) 5-325 MG tablet Take 1 tablet by mouth every 8 (eight) hours as needed for severe pain. 08/26/20   Zadie Rhine, MD  phenytoin (DILANTIN) 100 MG ER capsule Take 1 capsule (100 mg total) by mouth 3 (three) times daily. 09/19/20   Jacalyn Lefevre, MD  albuterol (PROVENTIL HFA;VENTOLIN HFA) 108 (90 BASE) MCG/ACT inhaler Inhale 1-2 puffs into the lungs every 6 (six) hours as needed for wheezing or shortness of breath. Patient not taking: Reported on 08/19/2018 01/17/15 01/12/20  Joycie Peek, PA-C    Allergies    Penicillins  Review of Systems   Review of Systems  Constitutional:  Positive for fever. Negative for appetite change.  Respiratory:  Negative for shortness of breath.   Cardiovascular:  Negative for chest pain.  Gastrointestinal:  Positive for nausea.  Genitourinary:  Positive for genital sores. Negative for difficulty urinating.  Musculoskeletal:  Negative for back pain.  Skin:  Positive for rash.  Neurological:  Negative for weakness.  Psychiatric/Behavioral:  Negative for confusion.    Physical Exam Updated Vital Signs BP (!) 121/95 (BP Location: Left Arm)   Pulse 86   Temp 98.2 F (36.8 C) (Oral)   Resp 16   Ht 5\' 9"  (1.753 m)   Wt 63.5 kg   SpO2 99%   BMI 20.67 kg/m   Physical Exam Vitals reviewed.  Constitutional:      Appearance: Normal appearance.  HENT:     Head: Atraumatic.     Mouth/Throat:     Mouth: Mucous membranes are moist.  Eyes:     General: No scleral icterus.    Pupils: Pupils are equal, round, and reactive to light.  Cardiovascular:     Rate and Rhythm: Regular rhythm.  Pulmonary:     Breath sounds: No wheezing.  Abdominal:     Tenderness: There is no abdominal tenderness.  Genitourinary:     Comments: Lesion on inferior aspect of penis. Musculoskeletal:     Cervical back: Neck supple.  Skin:    Capillary Refill: Capillary refill takes less than 2 seconds.     Comments: Multiple pustular lesions on face, extremities, also on some palms and feet.  Also some on trunk.  Neurological:     Mental Status: He is alert and oriented to person, place, and time.       ED Results / Procedures / Treatments   Labs (all labs ordered are listed, but only abnormal results are displayed) Labs Reviewed  CBC WITH DIFFERENTIAL/PLATELET - Abnormal; Notable for the following components:      Result Value   WBC 13.9 (*)    Neutro Abs 9.3 (*)    Eosinophils Absolute 0.6 (*)    All other components within normal limits  COMPREHENSIVE METABOLIC PANEL - Abnormal; Notable for the following components:   Glucose, Bld 193 (*)    All other components within normal limits  RAPID HIV SCREEN (HIV 1/2 AB+AG)  LIPASE, BLOOD  RPR  FLUORESCENT TREPONEMAL AB(FTA)-IGG-BLD  GC/CHLAMYDIA PROBE AMP ([place]) NOT AT [facility]    EKG None  Radiology No results found.  Procedures Procedures   Medications Ordered in ED Medications  doxycycline (VIBRA-TABS) tablet 100 mg (100 mg Oral Given 01/29/21 2257)    ED Course  I have reviewed the triage vital signs and the nursing notes.  Pertinent labs & imaging results that were available during my care of the patient were reviewed by me and considered in my medical decision making (see chart for details).    MDM Rules/Calculators/A&P                           Patient with rash.  Also penile lesion.  I think likely syphilis.  Has had previously.  Discussed with infectious disease.  Added treponemal antibodies.  RPR also pending.  HIV negative.  Also gonorrhea and chlamydia sent.  Would like to treat with penicillin, however states he has an allergy to it.  States that when he was treated for chlamydia previously he passed out after the shot.  States he  got very pale.  States he has never done with any other shots.  Although this could just be reaction to the shot and also potentially could be an anaphylaxis.  Unsure if he had a rash or other findings with that episode.  However with the potential anaphylaxis to penicillin we will treat with doxycycline.  Discussed with infectious disease and can follow-up with the  ID clinic. Final Clinical Impression(s) / ED Diagnoses Final diagnoses:  Syphilis    Rx / DC Orders ED Discharge Orders          Ordered    doxycycline (VIBRAMYCIN) 100 MG capsule  2 times daily,   Status:  Discontinued        01/29/21 2243    ondansetron (ZOFRAN-ODT) 4 MG disintegrating tablet  Every 8 hours PRN,   Status:  Discontinued        01/29/21 2244    doxycycline (VIBRAMYCIN) 100 MG capsule  2 times daily        01/29/21 2314    ondansetron (ZOFRAN-ODT) 4 MG disintegrating tablet  Every 8 hours PRN        01/29/21 2314             Benjiman Core, MD 01/29/21 2353

## 2021-01-29 NOTE — ED Triage Notes (Signed)
Pt reports nausea with vomiting for approximately three days. Pt states he is worried about a new rash that appeared first in his genital area, now spreading throughout. Pt states he wants to be evaluated for Monkeypox. Denies known contact with virus.

## 2021-01-29 NOTE — Discharge Instructions (Addendum)
You likely have syphilis.  Take the antibiotic to help.  Follow-up with the infectious disease doctors.  You have been given doxycycline since you have reported allergy to penicillins although this may need to be investigated further.

## 2021-01-30 LAB — GC/CHLAMYDIA PROBE AMP (~~LOC~~) NOT AT ARMC
Chlamydia: NEGATIVE
Comment: NEGATIVE
Comment: NORMAL
Neisseria Gonorrhea: NEGATIVE

## 2021-01-30 LAB — RPR
RPR Ser Ql: REACTIVE — AB
RPR Titer: 1:2 {titer}

## 2021-01-31 LAB — FLUORESCENT TREPONEMAL AB(FTA)-IGG-BLD: Fluorescent Treponemal Ab, IgG: REACTIVE — AB

## 2021-02-01 LAB — T.PALLIDUM AB, TOTAL: T Pallidum Abs: REACTIVE — AB

## 2021-03-15 ENCOUNTER — Encounter (HOSPITAL_COMMUNITY): Payer: Self-pay

## 2021-03-15 ENCOUNTER — Emergency Department (HOSPITAL_COMMUNITY)
Admission: EM | Admit: 2021-03-15 | Discharge: 2021-03-15 | Disposition: A | Discharge status: Home / Self Care | Payer: Self-pay | Attending: Emergency Medicine | Admitting: Emergency Medicine

## 2021-03-15 ENCOUNTER — Other Ambulatory Visit: Payer: Self-pay

## 2021-03-15 DIAGNOSIS — J45909 Unspecified asthma, uncomplicated: Secondary | ICD-10-CM | POA: Insufficient documentation

## 2021-03-15 DIAGNOSIS — F17210 Nicotine dependence, cigarettes, uncomplicated: Secondary | ICD-10-CM | POA: Insufficient documentation

## 2021-03-15 DIAGNOSIS — R569 Unspecified convulsions: Secondary | ICD-10-CM | POA: Insufficient documentation

## 2021-03-15 MED ORDER — PHENYTOIN SODIUM EXTENDED 100 MG PO CAPS: 100.0000 mg | ORAL_CAPSULE | Freq: Three times a day (TID) | ORAL | 1 refills | Status: AC

## 2021-03-15 NOTE — ED Notes (Signed)
RN went over seizure precautions for patient to take. 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 Patient stated he has a ride home.

## 2021-03-15 NOTE — ED Notes (Signed)
Patient stated he was ready to leave while RN was completing triage. RN educated patient on importance of being evaluated after his seizure. RN told Dr. Criss Alvine, who is at bedside talking to patient about the risks of leaving. Patient still wanted to leave and asked for a refill of his seizure medication.

## 2021-03-15 NOTE — ED Provider Notes (Signed)
[facility]-EMERGENCY DEPT Provider Note   CSN: [id number] Arrival date & time: 03/15/21  1924     History Chief Complaint  Patient presents with  . Seizures    Bruce Galvan is a 35 y.o. male.  HPI 35 year old male presents after a seizure at work.  Patient states that he is not compliant with his meds.  He was prescribed Keppra months ago but does not take it.  He is on Dilantin and last took it 1-1/2 weeks ago.  He usually takes it when he feels like he might have a seizure.  He denies any recent illness and no headache or fever.  He states he otherwise feels fine and wants to leave.  Has a known history of seizures since he was a teenager.  Past Medical History:  Diagnosis Date  . Asthma   . Seizure (HCC)     There are no problems to display for this patient.   History reviewed. No pertinent surgical history.     Family History  Problem Relation Age of Onset  . Cancer Other     Social History   Tobacco Use  . Smoking status: Every Day    Packs/day: 0.75    Types: Cigarettes  . Smokeless tobacco: Never  Vaping Use  . Vaping Use: Never used  Substance Use Topics  . Alcohol use: Yes    Comment: Once a week.   . Drug use: No    Home Medications Prior to Admission medications   Medication Sig Start Date End Date Taking? Authorizing Provider  doxycycline (VIBRAMYCIN) 100 MG capsule Take 1 capsule (100 mg total) by mouth 2 (two) times daily. 01/29/21   Benjiman Core, MD  ibuprofen (ADVIL) 600 MG tablet Take 1 tablet (600 mg total) by mouth every 6 (six) hours as needed. Patient not taking: No sig reported 01/12/20   Fayrene Helper, PA-C  levETIRAcetam (KEPPRA) 500 MG tablet Take 1 tablet (500 mg total) by mouth 2 (two) times daily. 08/26/20   Zadie Rhine, MD  ondansetron (ZOFRAN-ODT) 4 MG disintegrating tablet Take 1 tablet (4 mg total) by mouth every 8 (eight) hours as needed for nausea or vomiting. 01/29/21   Benjiman Core, MD   oxyCODONE-acetaminophen (PERCOCET) 5-325 MG tablet Take 1 tablet by mouth every 8 (eight) hours as needed for severe pain. 08/26/20   Zadie Rhine, MD  phenytoin (DILANTIN) 100 MG ER capsule Take 1 capsule (100 mg total) by mouth 3 (three) times daily. 03/15/21   Pricilla Loveless, MD  albuterol (PROVENTIL HFA;VENTOLIN HFA) 108 (90 BASE) MCG/ACT inhaler Inhale 1-2 puffs into the lungs every 6 (six) hours as needed for wheezing or shortness of breath. Patient not taking: Reported on 08/19/2018 01/17/15 01/12/20  Joycie Peek, PA-C    Allergies    Penicillins  Review of Systems   Review of Systems  Constitutional:  Negative for fever.  Gastrointestinal:  Negative for diarrhea and vomiting.  Neurological:  Positive for seizures. Negative for weakness, numbness and headaches.  All other systems reviewed and are negative.  Physical Exam Updated Vital Signs BP (!) 149/120 (BP Location: Right Arm)   Pulse (!) 107   Temp 98.1 F (36.7 C) (Oral)   Resp 20   Ht 5\' 9"  (1.753 m)   Wt 68 kg   SpO2 98%   BMI 22.15 kg/m   Physical Exam Vitals and nursing note reviewed.  Constitutional:      General: He is not in acute distress.  Appearance: He is well-developed. He is not ill-appearing or diaphoretic.  HENT:     Head: Normocephalic and atraumatic.     Right Ear: External ear normal.     Left Ear: External ear normal.     Nose: Nose normal.  Eyes:     General:        Right eye: No discharge.        Left eye: No discharge.     Extraocular Movements: Extraocular movements intact.     Pupils: Pupils are equal, round, and reactive to light.  Cardiovascular:     Rate and Rhythm: Normal rate and regular rhythm.     Heart sounds: Normal heart sounds.  Pulmonary:     Effort: Pulmonary effort is normal.     Breath sounds: Normal breath sounds.  Abdominal:     Palpations: Abdomen is soft.     Tenderness: There is no abdominal tenderness.  Musculoskeletal:     Cervical back: Neck  supple. No rigidity.  Skin:    General: Skin is warm and dry.  Neurological:     Mental Status: He is alert and oriented to person, place, and time.     Comments: CN 3-12 grossly intact. 5/5 strength in all 4 extremities. Grossly normal sensation. Normal finger to nose.   Psychiatric:        Mood and Affect: Mood is not anxious.    ED Results / Procedures / Treatments   Labs (all labs ordered are listed, but only abnormal results are displayed) Labs Reviewed - No data to display  EKG None  Radiology No results found.  Procedures Procedures   Medications Ordered in ED Medications - No data to display  ED Course  I have reviewed the triage vital signs and the nursing notes.  Pertinent labs & imaging results that were available during my care of the patient were reviewed by me and considered in my medical decision making (see chart for details).    MDM Rules/Calculators/A&P                           Patient is not altered and at this point is capable of making his decisions for himself.  He does not want to stay for further work-up, observation, or treatment of seizures.  I do not think there is any reason to hold him against as well.  He does not appear to have any acute CNS emergency.  I did discuss that is possible he might have a seizure as soon as he gets out of the door or later on tonight given we are not treating his seizure disorder but he understands this and just wants a prescription sent to his pharmacy.  Given is not altered I do not think labs will be particularly helpful.  No imaging indicated.  Informed he could return at any time. Final Clinical Impression(s) / ED Diagnoses Final diagnoses:  Seizure [facility])    Rx / DC Orders ED Discharge Orders          Ordered    phenytoin (DILANTIN) 100 MG ER capsule  3 times daily        03/15/21 1940             Pricilla Loveless, MD 03/15/21 2001

## 2021-03-15 NOTE — Discharge Instructions (Addendum)
It is very important to take your medicines every day as prescribed as opposed to when you think you need them for seizures to help prevent further seizures.  If at any point you develop new or worsening symptoms or concerns then return to the ER for evaluation.  - According to [place] law, you can not drive unless you are seizure / syncope free for at least 6 months and under physician's care.    - Please maintain precautions. Do not participate in activities where a loss of awareness could harm you or someone else. No swimming alone, no tub bathing, no hot tubs, no driving, no operating motorized vehicles (cars, ATVs, motocycles, etc), lawnmowers, power tools or firearms. No standing at heights, such as rooftops, ladders or stairs. Avoid hot objects such as stoves, heaters, open fires. Wear a helmet when riding a bicycle, scooter, skateboard, etc. and avoid areas of traffic. Set your water heater to 120 degrees or less.

## 2021-03-15 NOTE — ED Triage Notes (Signed)
Patient BIB [facility] from [employer]. Had a seizure at work. History of seizures. Patient was sitting in a chair, no fall. Patient had an aura where he blacks out so he sat down. Patient ran out of his medications 2 weeks ago (Dilantin). Post ictal when EMS arrived. No headache or dizziness.   EMS  18G left forearm 190/110 HR 104
